# Patient Record
Sex: Male | Born: 1945 | Race: White | Hispanic: No | Marital: Married | Smoking: Current every day smoker
Health system: Southern US, Community
[De-identification: ages and names within clinical notes are randomized; demographics above are authoritative.]

## PROBLEM LIST (undated history)

## (undated) DIAGNOSIS — J439 Emphysema, unspecified: Secondary | ICD-10-CM

## (undated) DIAGNOSIS — I10 Essential (primary) hypertension: Secondary | ICD-10-CM

## (undated) DIAGNOSIS — M1990 Unspecified osteoarthritis, unspecified site: Secondary | ICD-10-CM

## (undated) HISTORY — PX: ABDOMINAL AORTA STENT: SHX1108

## (undated) HISTORY — DX: Unspecified osteoarthritis, unspecified site: M19.90

## (undated) HISTORY — PX: SHOULDER SURGERY: SHX246

## (undated) HISTORY — DX: Essential (primary) hypertension: I10

## (undated) HISTORY — PX: AMPUTATION FINGER: SHX6594

---

## 2021-01-09 ENCOUNTER — Encounter: Payer: Self-pay | Admitting: Nurse Practitioner

## 2021-01-09 LAB — LIPID PANEL
Cholesterol: 135 (ref 0–200)
HDL: 37 (ref 35–70)
LDL Cholesterol: 80
Triglycerides: 96 (ref 40–160)

## 2021-01-09 LAB — CBC AND DIFFERENTIAL
HCT: 49 (ref 41–53)
Hemoglobin: 16.8 (ref 13.5–17.5)
Neutrophils Absolute: 67
Platelets: 211 10*3/uL (ref 150–400)
WBC: 8.8

## 2021-01-09 LAB — BASIC METABOLIC PANEL
BUN: 22 — AB (ref 4–21)
CO2: 22 (ref 13–22)
Chloride: 103 (ref 99–108)
Creatinine: 1.2 (ref 0.6–1.3)
Glucose: 102
Potassium: 4.8 mEq/L (ref 3.5–5.1)
Sodium: 141 (ref 137–147)

## 2021-01-09 LAB — IRON,TIBC AND FERRITIN PANEL: Ferritin: 171

## 2021-01-09 LAB — HEPATIC FUNCTION PANEL
ALT: 24 U/L (ref 10–40)
AST: 24 (ref 14–40)
Alkaline Phosphatase: 70 (ref 25–125)
Bilirubin, Total: 0.4

## 2021-01-09 LAB — COMPREHENSIVE METABOLIC PANEL
Albumin: 4.6 (ref 3.5–5.0)
Calcium: 9.4 (ref 8.7–10.7)
Globulin: 2.5

## 2021-01-09 LAB — VITAMIN B12: Vitamin B-12: 418

## 2021-01-09 LAB — PSA: PSA: 3.3

## 2021-01-09 LAB — CBC: RBC: 5.42 — AB (ref 3.87–5.11)

## 2021-01-09 LAB — TESTOSTERONE: Testosterone: 328

## 2022-01-02 ENCOUNTER — Encounter: Payer: Self-pay | Admitting: Family Medicine

## 2022-01-02 ENCOUNTER — Ambulatory Visit
Admission: RE | Admit: 2022-01-02 | Discharge: 2022-01-02 | Disposition: A | Discharge status: Home / Self Care | Payer: Medicare FFS | Source: Ambulatory Visit | Attending: Family Medicine | Admitting: Family Medicine

## 2022-01-02 ENCOUNTER — Ambulatory Visit (INDEPENDENT_AMBULATORY_CARE_PROVIDER_SITE_OTHER): Payer: Medicare FFS | Admitting: Family Medicine

## 2022-01-02 ENCOUNTER — Ambulatory Visit
Admission: RE | Admit: 2022-01-02 | Discharge: 2022-01-02 | Disposition: A | Discharge status: Home / Self Care | Payer: Medicare FFS | Attending: Family Medicine | Admitting: Family Medicine

## 2022-01-02 VITALS — BP 147/76 | HR 76 | Temp 97.9°F | Ht 68.5 in | Wt 199.4 lb

## 2022-01-02 DIAGNOSIS — Z1159 Encounter for screening for other viral diseases: Secondary | ICD-10-CM

## 2022-01-02 DIAGNOSIS — M25511 Pain in right shoulder: Secondary | ICD-10-CM

## 2022-01-02 DIAGNOSIS — Z85828 Personal history of other malignant neoplasm of skin: Secondary | ICD-10-CM

## 2022-01-02 DIAGNOSIS — Z8619 Personal history of other infectious and parasitic diseases: Secondary | ICD-10-CM | POA: Diagnosis not present

## 2022-01-02 DIAGNOSIS — I6930 Unspecified sequelae of cerebral infarction: Secondary | ICD-10-CM

## 2022-01-02 DIAGNOSIS — I10 Essential (primary) hypertension: Secondary | ICD-10-CM

## 2022-01-02 DIAGNOSIS — E782 Mixed hyperlipidemia: Secondary | ICD-10-CM

## 2022-01-02 DIAGNOSIS — M2550 Pain in unspecified joint: Secondary | ICD-10-CM

## 2022-01-02 DIAGNOSIS — Z114 Encounter for screening for human immunodeficiency virus [HIV]: Secondary | ICD-10-CM

## 2022-01-02 DIAGNOSIS — Z9889 Other specified postprocedural states: Secondary | ICD-10-CM

## 2022-01-02 DIAGNOSIS — R3911 Hesitancy of micturition: Secondary | ICD-10-CM

## 2022-01-02 LAB — URINALYSIS, ROUTINE W REFLEX MICROSCOPIC
Bilirubin, UA: NEGATIVE
Glucose, UA: NEGATIVE
Ketones, UA: NEGATIVE
Leukocytes,UA: NEGATIVE
Nitrite, UA: NEGATIVE
Protein,UA: NEGATIVE
RBC, UA: NEGATIVE
Specific Gravity, UA: 1.01 (ref 1.005–1.030)
Urobilinogen, Ur: 0.2 mg/dL (ref 0.2–1.0)
pH, UA: 5.5 (ref 5.0–7.5)

## 2022-01-02 LAB — MICROALBUMIN, URINE WAIVED
Creatinine, Urine Waived: 50 mg/dL (ref 10–300)
Microalb, Ur Waived: 10 mg/L (ref 0–19)
Microalb/Creat Ratio: <30 mg/g (ref <30)

## 2022-01-02 MED ORDER — RAMIPRIL 10 MG PO CAPS: 10.0000 mg | ORAL_CAPSULE | Freq: Every day | ORAL | 1 refills | Status: DC

## 2022-01-02 MED ORDER — HYDROCHLOROTHIAZIDE 12.5 MG PO TABS: 12.5000 mg | ORAL_TABLET | Freq: Every day | ORAL | 1 refills | Status: DC

## 2022-01-02 MED ORDER — DICLOFENAC SODIUM 1 % EX GEL: 4.0000 g | Freq: Four times a day (QID) | CUTANEOUS | 12 refills | Status: DC

## 2022-01-02 MED ORDER — ATORVASTATIN CALCIUM 40 MG PO TABS: 40.0000 mg | ORAL_TABLET | Freq: Every day | ORAL | 1 refills | Status: DC

## 2022-01-02 NOTE — Assessment & Plan Note (Signed)
Referral to dermatology placed today. Will obtain records from previous PCP.  ?

## 2022-01-02 NOTE — Assessment & Plan Note (Addendum)
About a year ago. Treated 2x with antibiotics, but continues with symptoms. Will check labs for rheumatologic conditions and check for repeat infection. May need to see rheum for ? 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 Unclear if he was treated with atovaquone or just doxycycline. Await records from previous PCP.  ?

## 2022-01-02 NOTE — Assessment & Plan Note (Signed)
Likely due for repeat imaging- will await records from previous PCP and cardiologist. Continue to keep BP and cholesterol under good control. Labs drawn today. Await results.  ?

## 2022-01-02 NOTE — Assessment & Plan Note (Signed)
Under good control on current regimen. Continue current regimen. Continue to monitor. Call with any concerns. Refills given. Labs drawn today.   

## 2022-01-02 NOTE — Progress Notes (Signed)
? ?BP (!) 147/76   Pulse 76   Temp 97.9 ?F (36.6 ?C)   Ht 5' 8.5" (1.74 m)   Wt 199 lb 6.4 oz (90.4 kg)   SpO2 96%   BMI 29.88 kg/m?   ? ?Subjective:  ? ? Patient ID: Connor Holmes, male    DOB: 06/06/1946, 76 y.o.   MRN: [id number] ? ?HPI: ?Connor RinkJohn Holmes is a 76 y.o. male who presents today to establish care after moving from [place] ? ?Chief Complaint  ?Patient presents with  ? Establish Care  ?  Patient is here to establish care today, is coming from [place] in [place]   ? Hypertension  ? Hyperlipidemia  ? ?History of stroke, has some left over issues with drooling. ? ?SHOULDER PAIN ?Duration: 2 months ?Involved shoulder: right ?Mechanism of injury: unknown ?Location: diffuse ?Onset:gradual ?Severity: severe  ?Quality:  "pain" ?Frequency: intermittent ?Radiation: yes ?Aggravating factors: sitting in a recliner, moving, lifting    ?Alleviating factors: aleve ?Status: worse ?Treatments attempted: rest, ice, heat, and aleve  ?Relief with NSAIDs?:  mild ?Weakness: no ?Numbness: yes ?Decreased grip strength: no ?Redness: no ?Swelling: no ?Bruising: no ?Fevers: no ? ?HYPERTENSION / HYPERLIPIDEMIA ?Satisfied with current treatment? yes ?Duration of hypertension: chronic ?BP monitoring frequency: daily ?BP range: 120s/70s ?BP medication side effects: no ?Past BP meds: ramipril ?Duration of hyperlipidemia: chronic ?Cholesterol medication side effects: no ?Cholesterol supplements: none ?Past cholesterol medications: atorvastatin ?Medication compliance: excellent compliance ?Aspirin: yes ?Recent stressors: no ?Recurrent headaches: no ?Visual changes: no ?Palpitations: no ?Dyspnea: no ?Chest pain: no ?Lower extremity edema: no ?Dizzy/lightheaded: no ? ?Has been feeling foggy headed and having joint pain. Had lyme about a year ago, unclear how long he had had it for. Has had a lot of pain in his joints since then. He has otherwise been doing well with no other concerns or complaints at this time.  ? ?Active Ambulatory Problems  ?   Diagnosis Date Noted  ? Primary hypertension 01/02/2022  ? Mixed hyperlipidemia 01/02/2022  ? History of Lyme disease 01/02/2022  ? History of stroke with residual deficit 01/02/2022  ? History of AAA (abdominal aortic aneurysm) repair 01/02/2022  ? History of skin cancer 01/02/2022  ? ?Resolved Ambulatory Problems  ?  Diagnosis Date Noted  ? No Resolved Ambulatory Problems  ? ?Past Medical History:  ?Diagnosis Date  ? Arthritis   ? Hypertension   ? ?Past Surgical History:  ?Procedure Laterality Date  ? ABDOMINAL AORTA STENT    ? AMPUTATION FINGER    ? SHOULDER SURGERY Left   ? ?Outpatient Encounter Medications as of 01/02/2022  ?Medication Sig  ? aspirin EC 81 MG tablet Take 81 mg by mouth daily. Swallow whole.  ? cetirizine (ZYRTEC) 10 MG tablet Take 10 mg by mouth daily.  ? diclofenac Sodium (VOLTAREN) 1 % GEL Apply 4 g topically 4 (four) times daily.  ? [DISCONTINUED] atorvastatin (LIPITOR) 40 MG tablet Take 40 mg by mouth daily.  ? [DISCONTINUED] hydrochlorothiazide (HYDRODIURIL) 12.5 MG tablet Take 12.5 mg by mouth daily.  ? [DISCONTINUED] ramipril (ALTACE) 10 MG capsule Take 10 mg by mouth daily.  ? atorvastatin (LIPITOR) 40 MG tablet Take 1 tablet (40 mg total) by mouth daily.  ? hydrochlorothiazide (HYDRODIURIL) 12.5 MG tablet Take 1 tablet (12.5 mg total) by mouth daily.  ? ramipril (ALTACE) 10 MG capsule Take 1 capsule (10 mg total) by mouth daily.  ? ?No facility-administered encounter medications on file as of 01/02/2022.  ? ?Allergies  ?  Allergen Reactions  ? Other Nausea And Vomiting  ?  Patient states he is allergic to all opioids, he gets very sick.  ? ?Social History  ? ?Socioeconomic History  ? Marital status: Married  ?  Spouse name: Ana (LuLu)  ? Number of children: Not on file  ? Years of education: Not on file  ? Highest education level: Not on file  ?Occupational History  ? Not on file  ?Tobacco Use  ? Smoking status: Every Day  ?  Packs/day: 1.00  ?  Types: Cigarettes  ? Smokeless tobacco:  Never  ?Vaping Use  ? Vaping Use: Never used  ?Substance and Sexual Activity  ? Alcohol use: Yes  ?  Alcohol/week: 1.0 standard drink  ?  Types: 1 Shots of liquor per week  ? Drug use: Never  ? Sexual activity: Not Currently  ?Other Topics Concern  ? Not on file  ?Social History Narrative  ? Not on file  ? ?Social Determinants of Health  ? ?Financial Resource Strain: Not on file  ?Food Insecurity: Not on file  ?Transportation Needs: Not on file  ?Physical Activity: Not on file  ?Stress: Not on file  ?Social Connections: Not on file  ? ?Family History  ?Problem Relation Age of Onset  ? Alzheimer's disease Mother   ? Stroke Father   ? ? ? ?Review of Systems  ?Constitutional:  Positive for fatigue. Negative for activity change, appetite change, chills, diaphoresis, fever and unexpected weight change.  ?Respiratory: Negative.    ?Cardiovascular: Negative.   ?Gastrointestinal: Negative.   ?Musculoskeletal:  Positive for arthralgias and myalgias. Negative for back pain, gait problem, joint swelling, neck pain and neck stiffness.  ?Skin: Negative.   ?Neurological: Negative.   ?Psychiatric/Behavioral: Negative.    ? ?Per HPI unless specifically indicated above ? ?   ?Objective:  ?  ?BP (!) 147/76   Pulse 76   Temp 97.9 ?F (36.6 ?C)   Ht 5' 8.5" (1.74 m)   Wt 199 lb 6.4 oz (90.4 kg)   SpO2 96%   BMI 29.88 kg/m?   ?Wt Readings from Last 3 Encounters:  ?01/02/22 199 lb 6.4 oz (90.4 kg)  ?  ?Physical Exam ?Vitals and nursing note reviewed.  ?Constitutional:   ?   General: He is not in acute distress. ?   Appearance: Normal appearance. He is not ill-appearing, toxic-appearing or diaphoretic.  ?HENT:  ?   Head: Normocephalic and atraumatic.  ?   Right Ear: External ear normal.  ?   Left Ear: External ear normal.  ?   Nose: Nose normal.  ?   Mouth/Throat:  ?   Mouth: Mucous membranes are moist.  ?   Pharynx: Oropharynx is clear.  ?Eyes:  ?   General: No scleral icterus.    ?   Right eye: No discharge.     ?   Left eye: No  discharge.  ?   Extraocular Movements: Extraocular movements intact.  ?   Conjunctiva/sclera: Conjunctivae normal.  ?   Pupils: Pupils are equal, round, and reactive to light.  ?Cardiovascular:  ?   Rate and Rhythm: Normal rate and regular rhythm.  ?   Pulses: Normal pulses.  ?   Heart sounds: Normal heart sounds. No murmur heard. ?  No friction rub. No gallop.  ?Pulmonary:  ?   Effort: Pulmonary effort is normal. No respiratory distress.  ?   Breath sounds: Normal breath sounds. No stridor. No wheezing, rhonchi or rales.  ?Chest:  ?  Chest wall: No tenderness.  ?Musculoskeletal:     ?   General: Tenderness (AC joint) present. Normal range of motion.  ?   Cervical back: Normal range of motion and neck supple.  ?Skin: ?   General: Skin is warm and dry.  ?   Capillary Refill: Capillary refill takes less than 2 seconds.  ?   Coloration: Skin is not jaundiced or pale.  ?   Findings: No bruising, erythema, lesion or rash.  ?Neurological:  ?   General: No focal deficit present.  ?   Mental Status: He is alert and oriented to person, place, and time. Mental status is at baseline.  ?Psychiatric:     ?   Mood and Affect: Mood normal.     ?   Behavior: Behavior normal.     ?   Thought Content: Thought content normal.     ?   Judgment: Judgment normal.  ? ? ?Results for orders placed or performed in visit on 01/02/22  ?Urinalysis, Routine w reflex microscopic  ?Result Value Ref Range  ? Specific Gravity, UA 1.010 1.005 - 1.030  ? pH, UA 5.5 5.0 - 7.5  ? Color, UA Yellow Yellow  ? Appearance Ur Clear Clear  ? Leukocytes,UA Negative Negative  ? Protein,UA Negative Negative/Trace  ? Glucose, UA Negative Negative  ? Ketones, UA Negative Negative  ? RBC, UA Negative Negative  ? Bilirubin, UA Negative Negative  ? Urobilinogen, Ur 0.2 0.2 - 1.0 mg/dL  ? Nitrite, UA Negative Negative  ?Microalbumin, Urine Waived  ?Result Value Ref Range  ? Microalb, Ur Waived 10 0 - 19 mg/L  ? Creatinine, Urine Waived 50 10 - 300 mg/dL  ?  Microalb/Creat Ratio <30 <30 mg/g  ?CBC and differential  ?Result Value Ref Range  ? Hemoglobin 16.8 13.5 - 17.5  ? HCT 49 41 - 53  ? Neutrophils Absolute 67.00   ? Platelets 211 150 - 400 K/uL  ? WBC 8.8   ?CBC  ?Result Value Re

## 2022-01-02 NOTE — Assessment & Plan Note (Signed)
Running a little high today. Running well at home. Will continue current regimen and recheck 1 month. Call with any concerns. Refills given. Labs drawn today.  ?

## 2022-01-02 NOTE — Assessment & Plan Note (Signed)
Will keep BP and cholesterol under good control. Labs drawn today. Call with any concerns.  ?

## 2022-01-05 LAB — ANA+ENA+DNA/DS+ANTICH+CENTR
ANA Titer 1: NEGATIVE
Anti JO-1: <0.2 AI (ref 0.0–0.9)
Centromere Ab Screen: <0.2 AI (ref 0.0–0.9)
Chromatin Ab SerPl-aCnc: <0.2 AI (ref 0.0–0.9)
ENA RNP Ab: <0.2 AI (ref 0.0–0.9)
ENA SM Ab Ser-aCnc: <0.2 AI (ref 0.0–0.9)
ENA SSA (RO) Ab: <0.2 AI (ref 0.0–0.9)
ENA SSB (LA) Ab: <0.2 AI (ref 0.0–0.9)
Scleroderma (Scl-70) (ENA) Antibody, IgG: <0.2 AI (ref 0.0–0.9)
dsDNA Ab: <1 IU/mL (ref 0–9)

## 2022-01-05 LAB — LIPID PANEL W/O CHOL/HDL RATIO
Cholesterol, Total: 217 mg/dL — ABNORMAL HIGH (ref 100–199)
HDL: 39 mg/dL — ABNORMAL LOW (ref >39)
LDL Chol Calc (NIH): 151 mg/dL — ABNORMAL HIGH (ref 0–99)
Triglycerides: 150 mg/dL — ABNORMAL HIGH (ref 0–149)
VLDL Cholesterol Cal: 27 mg/dL (ref 5–40)

## 2022-01-05 LAB — COMPREHENSIVE METABOLIC PANEL
ALT: 25 IU/L (ref 0–44)
AST: 23 IU/L (ref 0–40)
Albumin/Globulin Ratio: 2 (ref 1.2–2.2)
Albumin: 4.9 g/dL — ABNORMAL HIGH (ref 3.7–4.7)
Alkaline Phosphatase: 61 IU/L (ref 44–121)
BUN/Creatinine Ratio: 15 (ref 10–24)
BUN: 18 mg/dL (ref 8–27)
Bilirubin Total: 0.4 mg/dL (ref 0.0–1.2)
CO2: 22 mmol/L (ref 20–29)
Calcium: 9.2 mg/dL (ref 8.6–10.2)
Chloride: 101 mmol/L (ref 96–106)
Creatinine, Ser: 1.17 mg/dL (ref 0.76–1.27)
Globulin, Total: 2.5 g/dL (ref 1.5–4.5)
Glucose: 92 mg/dL (ref 70–99)
Potassium: 4.5 mmol/L (ref 3.5–5.2)
Sodium: 140 mmol/L (ref 134–144)
Total Protein: 7.4 g/dL (ref 6.0–8.5)
eGFR: 65 mL/min/{1.73_m2} (ref >59)

## 2022-01-05 LAB — CBC WITH DIFFERENTIAL/PLATELET
Basophils Absolute: 0.1 10*3/uL (ref 0.0–0.2)
Basos: 1 %
EOS (ABSOLUTE): 0.2 10*3/uL (ref 0.0–0.4)
Eos: 2 %
Hematocrit: 54.5 % — ABNORMAL HIGH (ref 37.5–51.0)
Hemoglobin: 18.6 g/dL — ABNORMAL HIGH (ref 13.0–17.7)
Immature Grans (Abs): 0.1 10*3/uL (ref 0.0–0.1)
Immature Granulocytes: 1 %
Lymphocytes Absolute: 1.9 10*3/uL (ref 0.7–3.1)
Lymphs: 22 %
MCH: 32 pg (ref 26.6–33.0)
MCHC: 34.1 g/dL (ref 31.5–35.7)
MCV: 94 fL (ref 79–97)
Monocytes Absolute: 0.8 10*3/uL (ref 0.1–0.9)
Monocytes: 10 %
Neutrophils Absolute: 5.5 10*3/uL (ref 1.4–7.0)
Neutrophils: 64 %
Platelets: 191 10*3/uL (ref 150–450)
RBC: 5.81 x10E6/uL — ABNORMAL HIGH (ref 4.14–5.80)
RDW: 12.8 % (ref 11.6–15.4)
WBC: 8.6 10*3/uL (ref 3.4–10.8)

## 2022-01-05 LAB — HIV ANTIBODY (ROUTINE TESTING W REFLEX): HIV Screen 4th Generation wRfx: NONREACTIVE

## 2022-01-05 LAB — EHRLICHIA ANTIBODY PANEL
E. Chaffeensis (HME) IgM Titer: NEGATIVE
E.Chaffeensis (HME) IgG: NEGATIVE
HGE IgG Titer: NEGATIVE
HGE IgM Titer: NEGATIVE

## 2022-01-05 LAB — BABESIA MICROTI ANTIBODY PANEL
Babesia microti IgG: <1:10 {titer}
Babesia microti IgM: 1:160 {titer} — ABNORMAL HIGH

## 2022-01-05 LAB — TSH: TSH: 2.99 u[IU]/mL (ref 0.450–4.500)

## 2022-01-05 LAB — ROCKY MTN SPOTTED FVR ABS PNL(IGG+IGM)
RMSF IgG: POSITIVE — AB
RMSF IgM: 0.96 index — ABNORMAL HIGH (ref 0.00–0.89)

## 2022-01-05 LAB — RMSF, IGG, IFA: RMSF, IGG, IFA: 1:128 {titer} — ABNORMAL HIGH

## 2022-01-05 LAB — HEPATITIS C ANTIBODY: Hep C Virus Ab: NONREACTIVE

## 2022-01-05 LAB — CK: Total CK: 251 U/L (ref 41–331)

## 2022-01-05 LAB — URIC ACID: Uric Acid: 8 mg/dL (ref 3.8–8.4)

## 2022-01-05 LAB — PSA: Prostate Specific Ag, Serum: 4.2 ng/mL — ABNORMAL HIGH (ref 0.0–4.0)

## 2022-01-05 LAB — RHEUMATOID FACTOR: Rheumatoid fact SerPl-aCnc: <10 IU/mL (ref <14.0)

## 2022-01-05 LAB — LYME DISEASE SEROLOGY W/REFLEX: Lyme Total Antibody EIA: NEGATIVE

## 2022-01-07 ENCOUNTER — Other Ambulatory Visit: Payer: Self-pay | Admitting: Family Medicine

## 2022-01-07 MED ORDER — DOXYCYCLINE HYCLATE 100 MG PO TABS: 100.0000 mg | ORAL_TABLET | Freq: Two times a day (BID) | ORAL | 0 refills | Status: AC

## 2022-01-07 MED ORDER — ATOVAQUONE 750 MG/5ML PO SUSP: 750.0000 mg | Freq: Two times a day (BID) | ORAL | 0 refills | Status: AC

## 2022-01-09 ENCOUNTER — Telehealth: Payer: Self-pay | Admitting: Family Medicine

## 2022-01-09 NOTE — Telephone Encounter (Signed)
Copied from CRM [id number]. Topic: General - Call Back - No Documentation ?>> Jan 09, 2022  9:12 AM Marylen Ponto wrote: ?Reason for CRM: Pt stated he was returning call to [facility] regarding his lab results. Pt also stated he will be working outside but requests that she call him back. Cb# [phone] ?

## 2022-01-10 ENCOUNTER — Encounter: Payer: Self-pay | Admitting: Family Medicine

## 2022-01-10 DIAGNOSIS — I5189 Other ill-defined heart diseases: Secondary | ICD-10-CM | POA: Insufficient documentation

## 2022-01-10 DIAGNOSIS — R7301 Impaired fasting glucose: Secondary | ICD-10-CM | POA: Insufficient documentation

## 2022-02-05 ENCOUNTER — Ambulatory Visit (INDEPENDENT_AMBULATORY_CARE_PROVIDER_SITE_OTHER): Payer: Medicare FFS | Admitting: Family Medicine

## 2022-02-05 ENCOUNTER — Encounter: Payer: Self-pay | Admitting: Family Medicine

## 2022-02-05 VITALS — BP 127/74 | HR 64 | Temp 98.0°F | Wt 197.4 lb

## 2022-02-05 DIAGNOSIS — R4189 Other symptoms and signs involving cognitive functions and awareness: Secondary | ICD-10-CM | POA: Diagnosis not present

## 2022-02-05 DIAGNOSIS — R972 Elevated prostate specific antigen [PSA]: Secondary | ICD-10-CM | POA: Diagnosis not present

## 2022-02-05 DIAGNOSIS — M25511 Pain in right shoulder: Secondary | ICD-10-CM

## 2022-02-05 DIAGNOSIS — I10 Essential (primary) hypertension: Secondary | ICD-10-CM | POA: Diagnosis not present

## 2022-02-05 DIAGNOSIS — M4722 Other spondylosis with radiculopathy, cervical region: Secondary | ICD-10-CM | POA: Diagnosis not present

## 2022-02-05 MED ORDER — CETIRIZINE HCL 10 MG PO TABS: 10.0000 mg | ORAL_TABLET | Freq: Every day | ORAL | 4 refills | Status: DC

## 2022-02-05 NOTE — Progress Notes (Signed)
BP 127/74   Pulse 64   Temp 98 F (36.7 C)   Wt 197 lb 6.4 oz (89.5 kg)   SpO2 98%   BMI 29.58 kg/m    Subjective:    Patient ID: Connor Holmes, male    DOB: 1946/04/09, 76 y.o.   MRN: [id number]  HPI: Connor Holmes is a 76 y.o. male  Chief Complaint  Patient presents with   Hypertension   Shoulder Pain    Patient states his right shoulder pain is about the same    Lyme Disease    Patient states he has some concerns about lyme disease    Has been having "fuzziness" in his head. Almost constant. Sometimes goes away later in the day. He is not sure if anything makes it better or worse, but he does note that taking his allergy pill sometimes helps with it.   HYPERTENSION Hypertension status: controlled  Satisfied with current treatment? yes Duration of hypertension: chronic BP monitoring frequency:  not checking BP medication side effects:  no Medication compliance: good compliance Previous BP meds:rapipril, HCTZ Aspirin: no Recurrent headaches: no Visual changes: no Palpitations: no Dyspnea: no Chest pain: no Lower extremity edema: no Dizzy/lightheaded: no  SHOULDER PAIN Duration: 3 months Involved shoulder: right Mechanism of injury: unknown Location: diffuse Onset:gradual Severity: severe  Quality:  pain Frequency: intermittent Radiation: yes Aggravating factors: sitting in a recliner, moving, lifting  Alleviating factors: aleve  Status: worse Treatments attempted: rest, ice, heat, sling, APAP, ibuprofen, aleve, and HEP  Relief with NSAIDs?:  mild Weakness: no Numbness: yes Decreased grip strength: no Redness: no Swelling: no Bruising: no Fevers: no   Relevant past medical, surgical, family and social history reviewed and updated as indicated. Interim medical history since our last visit reviewed. Allergies and medications reviewed and updated.  Review of Systems  Constitutional: Negative.   HENT: Negative.    Eyes: Negative.   Respiratory:  Negative.    Cardiovascular: Negative.   Gastrointestinal: Negative.   Musculoskeletal:  Positive for arthralgias. Negative for back pain, gait problem, joint swelling, myalgias, neck pain and neck stiffness.  Neurological: Negative.   Hematological: Negative.   Psychiatric/Behavioral: Negative.      Per HPI unless specifically indicated above     Objective:    BP 127/74   Pulse 64   Temp 98 F (36.7 C)   Wt 197 lb 6.4 oz (89.5 kg)   SpO2 98%   BMI 29.58 kg/m   Wt Readings from Last 3 Encounters:  02/05/22 197 lb 6.4 oz (89.5 kg)  01/02/22 199 lb 6.4 oz (90.4 kg)    Physical Exam Vitals and nursing note reviewed.  Constitutional:      General: He is not in acute distress.    Appearance: Normal appearance. He is not ill-appearing, toxic-appearing or diaphoretic.  HENT:     Head: Normocephalic and atraumatic.     Right Ear: External ear normal.     Left Ear: External ear normal.     Nose: Nose normal.     Mouth/Throat:     Mouth: Mucous membranes are moist.     Pharynx: Oropharynx is clear.  Eyes:     General: No scleral icterus.       Right eye: No discharge.        Left eye: No discharge.     Extraocular Movements: Extraocular movements intact.     Conjunctiva/sclera: Conjunctivae normal.     Pupils: Pupils are equal, round, and reactive to  light.  Cardiovascular:     Rate and Rhythm: Normal rate and regular rhythm.     Pulses: Normal pulses.     Heart sounds: Normal heart sounds. No murmur heard.    No friction rub. No gallop.  Pulmonary:     Effort: Pulmonary effort is normal. No respiratory distress.     Breath sounds: Normal breath sounds. No stridor. No wheezing, rhonchi or rales.  Chest:     Chest wall: No tenderness.  Musculoskeletal:        General: Normal range of motion.     Cervical back: Normal range of motion and neck supple.  Skin:    General: Skin is warm and dry.     Capillary Refill: Capillary refill takes less than 2 seconds.      Coloration: Skin is not jaundiced or pale.     Findings: No bruising, erythema, lesion or rash.  Neurological:     General: No focal deficit present.     Mental Status: He is alert and oriented to person, place, and time. Mental status is at baseline.  Psychiatric:        Mood and Affect: Mood normal.        Behavior: Behavior normal.        Thought Content: Thought content normal.        Judgment: Judgment normal.     Results for orders placed or performed in visit on 01/02/22  Comprehensive metabolic panel  Result Value Ref Range   Glucose 92 70 - 99 mg/dL   BUN 18 8 - 27 mg/dL   Creatinine, Ser 1.17 0.76 - 1.27 mg/dL   eGFR 65 >59 mL/min/1.73   BUN/Creatinine Ratio 15 10 - 24   Sodium 140 134 - 144 mmol/L   Potassium 4.5 3.5 - 5.2 mmol/L   Chloride 101 96 - 106 mmol/L   CO2 22 20 - 29 mmol/L   Calcium 9.2 8.6 - 10.2 mg/dL   Total Protein 7.4 6.0 - 8.5 g/dL   Albumin 4.9 (H) 3.7 - 4.7 g/dL   Globulin, Total 2.5 1.5 - 4.5 g/dL   Albumin/Globulin Ratio 2.0 1.2 - 2.2   Bilirubin Total 0.4 0.0 - 1.2 mg/dL   Alkaline Phosphatase 61 44 - 121 IU/L   AST 23 0 - 40 IU/L   ALT 25 0 - 44 IU/L  CBC with Differential/Platelet  Result Value Ref Range   WBC 8.6 3.4 - 10.8 x10E3/uL   RBC 5.81 (H) 4.14 - 5.80 x10E6/uL   Hemoglobin 18.6 (H) 13.0 - 17.7 g/dL   Hematocrit 54.5 (H) 37.5 - 51.0 %   MCV 94 79 - 97 fL   MCH 32.0 26.6 - 33.0 pg   MCHC 34.1 31.5 - 35.7 g/dL   RDW 12.8 11.6 - 15.4 %   Platelets 191 150 - 450 x10E3/uL   Neutrophils 64 Not Estab. %   Lymphs 22 Not Estab. %   Monocytes 10 Not Estab. %   Eos 2 Not Estab. %   Basos 1 Not Estab. %   Neutrophils Absolute 5.5 1.4 - 7.0 x10E3/uL   Lymphocytes Absolute 1.9 0.7 - 3.1 x10E3/uL   Monocytes Absolute 0.8 0.1 - 0.9 x10E3/uL   EOS (ABSOLUTE) 0.2 0.0 - 0.4 x10E3/uL   Basophils Absolute 0.1 0.0 - 0.2 x10E3/uL   Immature Granulocytes 1 Not Estab. %   Immature Grans (Abs) 0.1 0.0 - 0.1 x10E3/uL  Lipid Panel w/o Chol/HDL  Ratio  Result Value Ref Range  Cholesterol, Total 217 (H) 100 - 199 mg/dL   Triglycerides 150 (H) 0 - 149 mg/dL   HDL 39 (L) >39 mg/dL   VLDL Cholesterol Cal 27 5 - 40 mg/dL   LDL Chol Calc (NIH) 151 (H) 0 - 99 mg/dL  PSA  Result Value Ref Range   Prostate Specific Ag, Serum 4.2 (H) 0.0 - 4.0 ng/mL  TSH  Result Value Ref Range   TSH 2.990 0.450 - 4.500 uIU/mL  Urinalysis, Routine w reflex microscopic  Result Value Ref Range   Specific Gravity, UA 1.010 1.005 - 1.030   pH, UA 5.5 5.0 - 7.5   Color, UA Yellow Yellow   Appearance Ur Clear Clear   Leukocytes,UA Negative Negative   Protein,UA Negative Negative/Trace   Glucose, UA Negative Negative   Ketones, UA Negative Negative   RBC, UA Negative Negative   Bilirubin, UA Negative Negative   Urobilinogen, Ur 0.2 0.2 - 1.0 mg/dL   Nitrite, UA Negative Negative  Microalbumin, Urine Waived  Result Value Ref Range   Microalb, Ur Waived 10 0 - 19 mg/L   Creatinine, Urine Waived 50 10 - 300 mg/dL   Microalb/Creat Ratio <30 <30 mg/g  HIV Antibody (routine testing w rflx)  Result Value Ref Range   HIV Screen 4th Generation wRfx Non Reactive Non Reactive  Hepatitis C Antibody  Result Value Ref Range   Hep C Virus Ab Non Reactive Non Reactive  Rocky mtn spotted fvr abs pnl(IgG+IgM)  Result Value Ref Range   RMSF IgG Positive (A) Negative   RMSF IgM 0.96 (H) 0.00 - 0.89 index  Babesia microti Antibody Panel  Result Value Ref Range   Babesia microti IgM 1:160 (H) Neg:<1:10   Babesia microti IgG <9:67 RFF:<6:38  Ehrlichia Antibody Panel  Result Value Ref Range   E.Chaffeensis (HME) IgG Negative Neg:<1:64   E. Chaffeensis (HME) IgM Titer Negative Neg:<1:20   HGE IgG Titer Negative Neg:<1:64   HGE IgM Titer Negative Neg:<1:20  Rheumatoid factor  Result Value Ref Range   Rhuematoid fact SerPl-aCnc <10.0 <14.0 IU/mL  ANA+ENA+DNA/DS+Antich+Centr  Result Value Ref Range   ANA Titer 1 Negative    dsDNA Ab <1 0 - 9 IU/mL   ENA RNP  Ab <0.2 0.0 - 0.9 AI   ENA SM Ab Ser-aCnc <0.2 0.0 - 0.9 AI   Scleroderma (Scl-70) (ENA) Antibody, IgG <0.2 0.0 - 0.9 AI   ENA SSA (RO) Ab <0.2 0.0 - 0.9 AI   ENA SSB (LA) Ab <0.2 0.0 - 0.9 AI   Chromatin Ab SerPl-aCnc <0.2 0.0 - 0.9 AI   Anti JO-1 <0.2 0.0 - 0.9 AI   Centromere Ab Screen <0.2 0.0 - 0.9 AI   See below: Comment   CK  Result Value Ref Range   Total CK 251 41 - 331 U/L  Uric acid  Result Value Ref Range   Uric Acid 8.0 3.8 - 8.4 mg/dL  CBC and differential  Result Value Ref Range   Hemoglobin 16.8 13.5 - 17.5   HCT 49 41 - 53   Neutrophils Absolute 67.00    Platelets 211 150 - 400 K/uL   WBC 8.8   CBC  Result Value Ref Range   RBC 5.42 (A) 3.87 - 5.11  Iron, TIBC and Ferritin Panel  Result Value Ref Range   Ferritin 466   Basic metabolic panel  Result Value Ref Range   Glucose 102    BUN 22 (A) 4 - 21  CO2 22 13 - 22   Creatinine 1.2 0.6 - 1.3   Potassium 4.8 3.5 - 5.1 mEq/L   Sodium 141 137 - 147   Chloride 103 99 - 108  Comprehensive metabolic panel  Result Value Ref Range   Globulin 2.5    Calcium 9.4 8.7 - 10.7   Albumin 4.6 3.5 - 5.0  Lipid panel  Result Value Ref Range   Triglycerides 96 40 - 160   Cholesterol 135 0 - 200   HDL 37 35 - 70   LDL Cholesterol 80   Hepatic function panel  Result Value Ref Range   Alkaline Phosphatase 70 25 - 125   ALT 24 10 - 40 U/L   AST 24 14 - 40   Bilirubin, Total 0.4   Vitamin B12  Result Value Ref Range   Vitamin B-12 418   PSA  Result Value Ref Range   PSA 3.3   Testosterone  Result Value Ref Range   Testosterone 328   Lyme Disease Serology w/Reflex  Result Value Ref Range   Lyme Total Antibody EIA Negative Negative  RMSF, IgG, IFA  Result Value Ref Range   RMSF, IGG, IFA 1:128 (H) Neg <1:64      Assessment & Plan:   Problem List Items Addressed This Visit       Cardiovascular and Mediastinum   Primary hypertension    Has not been taking his HCTZ, but BP is doing well. Continue  current regimen. Continue to monitor.       Relevant Orders   Basic metabolic panel     Nervous and Auditory   Osteoarthritis of spine with radiculopathy, cervical region - Primary    Discussed x-ray results. Discussed PT vs referral to ortho. He would like to go see ortho. Referral generated today. Await their input.       Relevant Orders   Ambulatory referral to Orthopedic Surgery     Other   Brain fog    Discussed that there are several things that can cause brain fuzziness or brain fog. He notes that he thinks it's better when taking an allergy pill. Encouraged him to continue to take his allergy pill and increase his water intake. Call if not getting better or getting worse. Continue to monitor.       Other Visit Diagnoses     Elevated PSA       Rechecking labs today. Await results. Treat as needed.    Relevant Orders   PSA   Acute pain of right shoulder       Likely due to cervical DJD. Would like to see ortho. Referral generated today.   Relevant Orders   Ambulatory referral to Orthopedic Surgery        Follow up plan: Return in about 6 months (around 08/07/2022), or or before he goes to [place] for the Winter, for Physical.

## 2022-02-05 NOTE — Assessment & Plan Note (Signed)
Discussed that there are several things that can cause brain fuzziness or brain fog. He notes that he thinks it's better when taking an allergy pill. Encouraged him to continue to take his allergy pill and increase his water intake. Call if not getting better or getting worse. Continue to monitor.

## 2022-02-05 NOTE — Assessment & Plan Note (Signed)
Has not been taking his HCTZ, but BP is doing well. Continue current regimen. Continue to monitor.

## 2022-02-05 NOTE — Assessment & Plan Note (Signed)
Discussed x-ray results. Discussed PT vs referral to ortho. He would like to go see ortho. Referral generated today. Await their input.

## 2022-02-06 LAB — BASIC METABOLIC PANEL
BUN/Creatinine Ratio: 14 (ref 10–24)
BUN: 18 mg/dL (ref 8–27)
CO2: 23 mmol/L (ref 20–29)
Calcium: 10 mg/dL (ref 8.6–10.2)
Chloride: 101 mmol/L (ref 96–106)
Creatinine, Ser: 1.27 mg/dL (ref 0.76–1.27)
Glucose: 108 mg/dL — ABNORMAL HIGH (ref 70–99)
Potassium: 4.9 mmol/L (ref 3.5–5.2)
Sodium: 138 mmol/L (ref 134–144)
eGFR: 59 mL/min/{1.73_m2} — ABNORMAL LOW (ref >59)

## 2022-02-06 LAB — PSA: Prostate Specific Ag, Serum: 4.4 ng/mL — ABNORMAL HIGH (ref 0.0–4.0)

## 2022-03-20 ENCOUNTER — Ambulatory Visit (INDEPENDENT_AMBULATORY_CARE_PROVIDER_SITE_OTHER): Payer: Medicare FFS | Admitting: *Deleted

## 2022-03-20 DIAGNOSIS — Z0000 Encounter for general adult medical examination without abnormal findings: Secondary | ICD-10-CM

## 2022-03-20 NOTE — Progress Notes (Addendum)
Subjective:   Connor Holmes is a 76 y.o. male who presents for Medicare Annual/Subsequent preventive examination.  I connected with  Connor Holmes on 03/20/22 by a telephone enabled telemedicine application and verified that I am speaking with the correct person using two identifiers.   I discussed the limitations of evaluation and management by telemedicine. The patient expressed understanding and agreed to proceed.  Patient location: home  Provider location:  tele-health-home    Review of Systems     Cardiac Risk Factors include: advanced age (>64men, >67 women);obesity (BMI >30kg/m2);hypertension;male gender;smoking/ tobacco exposure     Objective:    Today's Vitals   03/20/22 1103  PainSc: 4    There is no height or weight on file to calculate BMI.      No data to display          Current Medications (verified) Outpatient Encounter Medications as of 03/20/2022  Medication Sig   atorvastatin (LIPITOR) 40 MG tablet Take 1 tablet (40 mg total) by mouth daily.   budesonide-formoterol (SYMBICORT) 80-4.5 MCG/ACT inhaler Inhale 2 puffs into the lungs 2 (two) times daily.   cetirizine (ZYRTEC) 10 MG tablet Take 1 tablet (10 mg total) by mouth daily.   fluticasone (FLONASE) 50 MCG/ACT nasal spray Place into both nostrils daily.   meloxicam (MOBIC) 15 MG tablet Take 15 mg by mouth daily. 1 x a day   ramipril (ALTACE) 10 MG capsule Take 1 capsule (10 mg total) by mouth daily.   aspirin EC 81 MG tablet Take 81 mg by mouth daily. Swallow whole. (Patient not taking: Reported on 02/05/2022)   No facility-administered encounter medications on file as of 03/20/2022.    Allergies (verified) Other   History: Past Medical History:  Diagnosis Date   Arthritis    Hypertension    Past Surgical History:  Procedure Laterality Date   ABDOMINAL AORTA STENT     AMPUTATION FINGER     SHOULDER SURGERY Left    Family History  Problem Relation Age of Onset   Alzheimer's disease  Mother    Stroke Father    Social History   Socioeconomic History   Marital status: Married    Spouse name: Ana (LuLu)   Number of children: Not on file   Years of education: Not on file   Highest education level: Not on file  Occupational History   Not on file  Tobacco Use   Smoking status: Every Day    Packs/day: 1.00    Types: Cigarettes   Smokeless tobacco: Never  Vaping Use   Vaping Use: Never used  Substance and Sexual Activity   Alcohol use: Yes    Alcohol/week: 1.0 standard drink of alcohol    Types: 1 Shots of liquor per week   Drug use: Never   Sexual activity: Not Currently  Other Topics Concern   Not on file  Social History Narrative   Not on file   Social Determinants of Health   Financial Resource Strain: Low Risk  (03/20/2022)   Overall Financial Resource Strain (CARDIA)    Difficulty of Paying Living Expenses: Not hard at all  Food Insecurity: No Food Insecurity (03/20/2022)   Hunger Vital Sign    Worried About Running Out of Food in the Last Year: Never true    Ran Out of Food in the Last Year: Never true  Transportation Needs: No Transportation Needs (03/20/2022)   PRAPARE - Administrator, Civil Service (Medical): No  Lack of Transportation (Non-Medical): No  Physical Activity: Inactive (03/20/2022)   Exercise Vital Sign    Days of Exercise per Week: 0 days    Minutes of Exercise per Session: 0 min  Stress: No Stress Concern Present (03/20/2022)   [employer] of Occupational Health - Occupational Stress Questionnaire    Feeling of Stress : Not at all  Social Connections: Socially Integrated (03/20/2022)   Social Connection and Isolation Panel [NHANES]    Frequency of Communication with Friends and Family: Twice a week    Frequency of Social Gatherings with Friends and Family: More than three times a week    Attends Religious Services: More than 4 times per year    Active Member of [employer] or Organizations: Yes    Attends Museum/gallery exhibitions officer: More than 4 times per year    Marital Status: Married    Tobacco Counseling Ready to quit: Not Answered Counseling given: Not Answered   Clinical Intake:  Pre-visit preparation completed: Yes  Pain : 0-10 Pain Score: 4  Pain Type: Chronic pain Pain Location: Shoulder Pain Orientation: Right Pain Descriptors / Indicators: Constant, Burning, Aching Pain Onset: 1 to 4 weeks ago Pain Relieving Factors: aleve  Pain Relieving Factors: aleve  Nutritional Risks: None Diabetes: No  How often do you need to have someone help you when you read instructions, pamphlets, or other written materials from your doctor or pharmacy?: 1 - Never  Diabetic?  no  Interpreter Needed?: No  Information entered by :: Remi Haggard LPN   Activities of Daily Living    03/20/2022   12:11 PM  In your present state of health, do you have any difficulty performing the following activities:  Hearing? 0  Vision? 0  Difficulty concentrating or making decisions? 0  Walking or climbing stairs? 0  Dressing or bathing? 0  Doing errands, shopping? 0  Preparing Food and eating ? N  Using the Toilet? N  In the past six months, have you accidently leaked urine? N  Do you have problems with loss of bowel control? N  Managing your Medications? N  Managing your Finances? N    Patient Care Team: Dorcas Carrow, DO as PCP - General (Family Medicine)  Indicate any recent Medical Services you may have received from other than [facility] providers in the past year (date may be approximate).     Assessment:   This is a routine wellness examination for Connor Holmes.  Hearing/Vision screen Hearing Screening - Comments:: No trouble hearing Vision Screening - Comments:: Not up to date  Dietary issues and exercise activities discussed: Current Exercise Habits: The patient does not participate in regular exercise at present   Goals Addressed             This Visit's Progress    Patient  Stated       Continue current lifestyle       Depression Screen    03/20/2022   11:25 AM 02/05/2022    9:19 AM 01/02/2022    9:21 AM  PHQ 2/9 Scores  PHQ - 2 Score  0 0  PHQ- 9 Score  0 9  Exception Documentation Patient refusal      Fall Risk    01/02/2022    9:21 AM  Fall Risk   Falls in the past year? 0  Number falls in past yr: 0  Injury with Fall? 0  Risk for fall due to : No Fall Risks  Follow up Falls evaluation  completed    FALL RISK PREVENTION PERTAINING TO THE HOME:  Any stairs in or around the home? Yes  If so, are there any without handrails? No  Home free of loose throw rugs in walkways, pet beds, electrical cords, etc? Yes  Adequate lighting in your home to reduce risk of falls? Yes   ASSISTIVE DEVICES UTILIZED TO PREVENT FALLS:  Life alert? No  Use of a cane, walker or w/c? No  Grab bars in the bathroom? Yes  Shower chair or bench in shower? Yes  Elevated toilet seat or a handicapped toilet? No   TIMED UP AND GO:  Was the test performed? No .    Cognitive Function:        03/20/2022   11:08 AM  6CIT Screen  What Year? 0 points  What month? 0 points  What time? 0 points  Count back from 20 0 points  Months in reverse 4 points    Immunizations  There is no immunization history on file for this patient.  TDAP status: Due, Education has been provided regarding the importance of this vaccine. Advised may receive this vaccine at local pharmacy or Health Dept. Aware to provide a copy of the vaccination record if obtained from local pharmacy or Health Dept. Verbalized acceptance and understanding.  Flu Vaccine status: Due, Education has been provided regarding the importance of this vaccine. Advised may receive this vaccine at local pharmacy or Health Dept. Aware to provide a copy of the vaccination record if obtained from local pharmacy or Health Dept. Verbalized acceptance and understanding.  Pneumococcal vaccine status: Due, Education has  been provided regarding the importance of this vaccine. Advised may receive this vaccine at local pharmacy or Health Dept. Aware to provide a copy of the vaccination record if obtained from local pharmacy or Health Dept. Verbalized acceptance and understanding.  Covid-19 vaccine status: Information provided on how to obtain vaccines.   Qualifies for Shingles Vaccine? Yes   Zostavax completed No   Shingrix Completed?: No.    Education has been provided regarding the importance of this vaccine. Patient has been advised to call insurance company to determine out of pocket expense if they have not yet received this vaccine. Advised may also receive vaccine at local pharmacy or Health Dept. Verbalized acceptance and understanding.  Screening Tests Health Maintenance  Topic Date Due   Zoster Vaccines- Shingrix (1 of 2) 04/04/2022 (Originally 11/25/1995)   COVID-19 Vaccine (1) 04/05/2022 (Originally 05/26/1946)   Pneumonia Vaccine 52+ Years old (1 - PCV) 03/21/2023 (Originally 11/25/1951)   TETANUS/TDAP  03/21/2023 (Originally 11/24/1964)   INFLUENZA VACCINE  03/26/2022   Hepatitis C Screening  Completed   HPV VACCINES  Aged Out    Health Maintenance  There are no preventive care reminders to display for this patient.   Colorectal cancer screening: No longer required.   Lung Cancer Screening: (Low Dose CT Chest recommended if Age 57-80 years, 30 pack-year currently smoking OR have quit w/in 15years.) q    Additional Screening:  Hepatitis C Screening: does not qualify; Completed 2023  Vision Screening: Recommended annual ophthalmology exams for early detection of glaucoma and other disorders of the eye. Is the patient up to date with their annual eye exam?  No  Who is the provider or what is the name of the office in which the patient attends annual eye exams? Information given to wife  If pt is not established with a provider, would they like to be referred to a  provider to establish care?  Yes .   Dental Screening: Recommended annual dental exams for proper oral hygiene  Community Resource Referral / Chronic Care Management: CRR required this visit?  No   CCM required this visit?  No      Plan:     I have personally reviewed and noted the following in the patient's chart:   Medical and social history Use of alcohol, tobacco or illicit drugs  Current medications and supplements including opioid prescriptions. Patient is not currently taking opioid prescriptions. Functional ability and status Nutritional status Physical activity Advanced directives List of other physicians Hospitalizations, surgeries, and ER visits in previous 12 months Vitals Screenings to include cognitive, depression, and falls Referrals and appointments  In addition, I have reviewed and discussed with patient certain preventive protocols, quality metrics, and best practice recommendations. A written personalized care plan for preventive services as well as general preventive health recommendations were provided to patient.     Remi Haggard, LPN   12/02/8117   Nurse Notes:

## 2022-03-20 NOTE — Patient Instructions (Signed)
Connor Holmes , Thank you for taking time to come for your Medicare Wellness Visit. I appreciate your ongoing commitment to your health goals. Please review the following plan we discussed and let me know if I can assist you in the future.   Screening recommendations/referrals: Colonoscopy: up to date Recommended yearly ophthalmology/optometry visit for glaucoma screening and checkup Recommended yearly dental visit for hygiene and checkup  Vaccinations: Influenza vaccine: Education provided Pneumococcal vaccine: Education provided Tdap vaccine: Education provided Shingles vaccine: Education provided    Advanced directives: Education provided      Preventive Care 65 Years and Older, Male Preventive care refers to lifestyle choices and visits with your health care provider that can promote health and wellness. What does preventive care include? A yearly physical exam. This is also called an annual well check. Dental exams once or twice a year. Routine eye exams. Ask your health care provider how often you should have your eyes checked. Personal lifestyle choices, including: Daily care of your teeth and gums. Regular physical activity. Eating a healthy diet. Avoiding tobacco and drug use. Limiting alcohol use. Practicing safe sex. Taking low doses of aspirin every day. Taking vitamin and mineral supplements as recommended by your health care provider. What happens during an annual well check? The services and screenings done by your health care provider during your annual well check will depend on your age, overall health, lifestyle risk factors, and family history of disease. Counseling  Your health care provider may ask you questions about your: Alcohol use. Tobacco use. Drug use. Emotional well-being. Home and relationship well-being. Sexual activity. Eating habits. History of falls. Memory and ability to understand (cognition). Work and work Astronomer. Screening  You  may have the following tests or measurements: Height, weight, and BMI. Blood pressure. Lipid and cholesterol levels. These may be checked every 5 years, or more frequently if you are over 71 years old. Skin check. Lung cancer screening. You may have this screening every year starting at age 57 if you have a 30-pack-year history of smoking and currently smoke or have quit within the past 15 years. Fecal occult blood test (FOBT) of the stool. You may have this test every year starting at age 6. Flexible sigmoidoscopy or colonoscopy. You may have a sigmoidoscopy every 5 years or a colonoscopy every 10 years starting at age 62. Prostate cancer screening. Recommendations will vary depending on your family history and other risks. Hepatitis C blood test. Hepatitis B blood test. Sexually transmitted disease (STD) testing. Diabetes screening. This is done by checking your blood sugar (glucose) after you have not eaten for a while (fasting). You may have this done every 1-3 years. Abdominal aortic aneurysm (AAA) screening. You may need this if you are a current or former smoker. Osteoporosis. You may be screened starting at age 36 if you are at high risk. Talk with your health care provider about your test results, treatment options, and if necessary, the need for more tests. Vaccines  Your health care provider may recommend certain vaccines, such as: Influenza vaccine. This is recommended every year. Tetanus, diphtheria, and acellular pertussis (Tdap, Td) vaccine. You may need a Td booster every 10 years. Zoster vaccine. You may need this after age 53. Pneumococcal 13-valent conjugate (PCV13) vaccine. One dose is recommended after age 17. Pneumococcal polysaccharide (PPSV23) vaccine. One dose is recommended after age 65. Talk to your health care provider about which screenings and vaccines you need and how often you need them. This information  is not intended to replace advice given to you by your  health care provider. Make sure you discuss any questions you have with your health care provider. Document Released: 09/08/2015 Document Revised: 05/01/2016 Document Reviewed: 06/13/2015 Elsevier Interactive Patient Education  2017 [place] Prevention in the Home Falls can cause injuries. They can happen to people of all ages. There are many things you can do to make your home safe and to help prevent falls. What can I do on the outside of my home? Regularly fix the edges of walkways and driveways and fix any cracks. Remove anything that might make you trip as you walk through a door, such as a raised step or threshold. Trim any bushes or trees on the path to your home. Use bright outdoor lighting. Clear any walking paths of anything that might make someone trip, such as rocks or tools. Regularly check to see if handrails are loose or broken. Make sure that both sides of any steps have handrails. Any raised decks and porches should have guardrails on the edges. Have any leaves, snow, or ice cleared regularly. Use sand or salt on walking paths during winter. Clean up any spills in your garage right away. This includes oil or grease spills. What can I do in the bathroom? Use night lights. Install grab bars by the toilet and in the tub and shower. Do not use towel bars as grab bars. Use non-skid mats or decals in the tub or shower. If you need to sit down in the shower, use a plastic, non-slip stool. Keep the floor dry. Clean up any water that spills on the floor as soon as it happens. Remove soap buildup in the tub or shower regularly. Attach bath mats securely with double-sided non-slip rug tape. Do not have throw rugs and other things on the floor that can make you trip. What can I do in the bedroom? Use night lights. Make sure that you have a light by your bed that is easy to reach. Do not use any sheets or blankets that are too big for your bed. They should not hang down  onto the floor. Have a firm chair that has side arms. You can use this for support while you get dressed. Do not have throw rugs and other things on the floor that can make you trip. What can I do in the kitchen? Clean up any spills right away. Avoid walking on wet floors. Keep items that you use a lot in easy-to-reach places. If you need to reach something above you, use a strong step stool that has a grab bar. Keep electrical cords out of the way. Do not use floor polish or wax that makes floors slippery. If you must use wax, use non-skid floor wax. Do not have throw rugs and other things on the floor that can make you trip. What can I do with my stairs? Do not leave any items on the stairs. Make sure that there are handrails on both sides of the stairs and use them. Fix handrails that are broken or loose. Make sure that handrails are as long as the stairways. Check any carpeting to make sure that it is firmly attached to the stairs. Fix any carpet that is loose or worn. Avoid having throw rugs at the top or bottom of the stairs. If you do have throw rugs, attach them to the floor with carpet tape. Make sure that you have a light switch at the top of  the stairs and the bottom of the stairs. If you do not have them, ask someone to add them for you. What else can I do to help prevent falls? Wear shoes that: Do not have high heels. Have rubber bottoms. Are comfortable and fit you well. Are closed at the toe. Do not wear sandals. If you use a stepladder: Make sure that it is fully opened. Do not climb a closed stepladder. Make sure that both sides of the stepladder are locked into place. Ask someone to hold it for you, if possible. Clearly mark and make sure that you can see: Any grab bars or handrails. First and last steps. Where the edge of each step is. Use tools that help you move around (mobility aids) if they are needed. These include: Canes. Walkers. Scooters. Crutches. Turn  on the lights when you go into a dark area. Replace any light bulbs as soon as they burn out. Set up your furniture so you have a clear path. Avoid moving your furniture around. If any of your floors are uneven, fix them. If there are any pets around you, be aware of where they are. Review your medicines with your doctor. Some medicines can make you feel dizzy. This can increase your chance of falling. Ask your doctor what other things that you can do to help prevent falls. This information is not intended to replace advice given to you by your health care provider. Make sure you discuss any questions you have with your health care provider. Document Released: 06/08/2009 Document Revised: 01/18/2016 Document Reviewed: 09/16/2014 Elsevier Interactive Patient Education  2017 [employer].

## 2022-04-08 ENCOUNTER — Encounter: Payer: Self-pay | Admitting: Nurse Practitioner

## 2022-04-08 ENCOUNTER — Ambulatory Visit: Payer: Self-pay | Admitting: *Deleted

## 2022-04-08 ENCOUNTER — Ambulatory Visit (INDEPENDENT_AMBULATORY_CARE_PROVIDER_SITE_OTHER): Payer: Medicare FFS | Admitting: Nurse Practitioner

## 2022-04-08 VITALS — BP 158/88 | HR 79 | Temp 97.5°F | Wt 202.3 lb

## 2022-04-08 DIAGNOSIS — R053 Chronic cough: Secondary | ICD-10-CM | POA: Insufficient documentation

## 2022-04-08 DIAGNOSIS — I10 Essential (primary) hypertension: Secondary | ICD-10-CM | POA: Diagnosis not present

## 2022-04-08 DIAGNOSIS — R600 Localized edema: Secondary | ICD-10-CM | POA: Diagnosis not present

## 2022-04-08 MED ORDER — AMOXICILLIN-POT CLAVULANATE 875-125 MG PO TABS: 1.0000 | ORAL_TABLET | Freq: Two times a day (BID) | ORAL | 0 refills | Status: AC

## 2022-04-08 MED ORDER — FLUTICASONE FUROATE-VILANTEROL 100-25 MCG/ACT IN AEPB: 1.0000 | INHALATION_SPRAY | Freq: Every day | RESPIRATORY_TRACT | 11 refills | Status: DC

## 2022-04-08 MED ORDER — LOSARTAN POTASSIUM 25 MG PO TABS: 25.0000 mg | ORAL_TABLET | Freq: Every day | ORAL | 1 refills | Status: DC

## 2022-04-08 MED ORDER — AMOXICILLIN-POT CLAVULANATE 875-125 MG PO TABS: 1.0000 | ORAL_TABLET | Freq: Two times a day (BID) | ORAL | 0 refills | Status: DC

## 2022-04-08 NOTE — Assessment & Plan Note (Signed)
Ongoing for 8 months since Covid per patient report.  Suspect some underlying lung disease with his smoking history, although he denies this as an issue.   - At this time will trial change to [facility] and stop Symbicort, suspect he may adhere to this better due to 1 puff a day -- educated him to rinse mouth out well after use. - Obtain CXR - Start Augmentin BID for 7 days, is currently on Prednisone with ortho - Cut back on smoking - Change from ACE to ARB, suspect some underlying lung disease present. - Obtain labs today to include: CBC, CMP, BNP, and Quantiferon ([place] risk [place], but wife and him both with cough) -- dependent on labs may need further testing or treatment.  Return in 2 weeks.

## 2022-04-08 NOTE — Progress Notes (Signed)
BP (!) 158/88 (BP Location: Left Arm, Patient Position: Sitting, Cuff Size: Normal)   Pulse 79   Temp (!) 97.5 F (36.4 C) (Oral)   Wt 202 lb 4.8 oz (91.8 kg)   SpO2 90%   BMI 30.31 kg/m    Subjective:    Patient ID: Connor Holmes, male    DOB: 07/23/1946, 75 y.o.   MRN: [id number]  HPI: Connor Holmes is a 76 y.o. male  Chief Complaint  Patient presents with   Edema    Bil feet swelling x 1 week.    Cough    For a while per patient.   HYPERTENSION & EDEMA Continues on Ramipril, in past was on HCTZ with this but did not tolerate this (made him feel bad) -- cardiology and GP in [place] told him to go off of this.  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 Hypertension status: uncontrolled  Satisfied with current treatment? yes Duration of hypertension: chronic BP monitoring frequency:  daily BP range: 120/60-70 range on average BP medication side effects:  no Medication compliance: good compliance Aspirin: no Recurrent headaches: no Visual changes: no Palpitations: no Dyspnea: no Chest pain: no Lower extremity edema: yes, noticed this one week ago -- no increase in salt in diet at the time -- was on Meloxicam for 3 weeks prior to edema appearing, stopped taking this recently and swelling has gotten 30 percent better thus far. Dizzy/lightheaded: no   COUGH Has been present for 8 months since Covid, using Symbicort only as needed at this time as does not like taste.  Not using as instructed.  Is a smoker, has smoked for 60 years.  Smokes between 1/2 to 1 PPD.  Is on Prednisone with ortho, past 2 days started.  Wife has had similar cough ongoing since Covid 8 months ago. Duration: months Circumstances of initial development of cough: Covid Cough severity: moderate Cough description: productive  Aggravating factors:  worse at night and in morning Alleviating factors: nothing Status:  fluctuating Treatments attempted: Symbicort Wheezing: yes Shortness of breath:  yes, strenuous activity only, denies orthopnea == sleeps on one pillow at night Chest pain: no Chest tightness:no Nasal congestion: yes Runny nose: yes Postnasal drip: yes Frequent throat clearing or swallowing: no Hemoptysis: no Fevers: no Night sweats: no Weight loss: no Heartburn: rarely Recent foreign travel:  lives [place] 7 months of year Tuberculosis contacts: no   Relevant past medical, surgical, family and social history reviewed and updated as indicated. Interim medical history since our last visit reviewed. Allergies and medications reviewed and updated.  Review of Systems  Constitutional:  Negative for activity change, diaphoresis, fatigue and fever.  Respiratory:  Positive for cough, shortness of breath (with strenuous activity) and wheezing. Negative for chest tightness.   Cardiovascular:  Positive for leg swelling. Negative for chest pain and palpitations.  Gastrointestinal: Negative.   Endocrine: Negative.   Neurological: Negative.   Psychiatric/Behavioral: Negative.      Per HPI unless specifically indicated above     Objective:    BP (!) 158/88 (BP Location: Left Arm, Patient Position: Sitting, Cuff Size: Normal)   Pulse 79   Temp (!) 97.5 F (36.4 C) (Oral)   Wt 202 lb 4.8 oz (91.8 kg)   SpO2 90%   BMI 30.31 kg/m   Wt Readings from Last 3 Encounters:  04/08/22 202 lb 4.8 oz (91.8 kg)  02/05/22 197 lb 6.4 oz (89.5 kg)  01/02/22 199 lb 6.4  oz (90.4 kg)    Physical Exam Vitals and nursing note reviewed.  Constitutional:      General: He is awake. He is not in acute distress.    Appearance: He is well-developed and well-groomed. He is obese. He is not ill-appearing or toxic-appearing.  HENT:     Head: Normocephalic and atraumatic.     Right Ear: Hearing normal. No drainage.     Left Ear: Hearing normal. No drainage.  Eyes:     General: Lids are normal.        Right eye: No discharge.        Left eye: No discharge.     Conjunctiva/sclera:  Conjunctivae normal.     Pupils: Pupils are equal, round, and reactive to light.  Neck:     Thyroid: No thyromegaly.     Vascular: No carotid bruit or JVD.  Cardiovascular:     Rate and Rhythm: Normal rate and regular rhythm.     Heart sounds: Normal heart sounds, S1 normal and S2 normal. No murmur heard.    No gallop.  Pulmonary:     Effort: Pulmonary effort is normal. No accessory muscle usage or respiratory distress.     Breath sounds: Wheezing present. No rhonchi.     Comments: Expiratory wheezes noted throughout lung fields. Abdominal:     General: Bowel sounds are normal.     Palpations: Abdomen is soft. There is no hepatomegaly or splenomegaly.  Musculoskeletal:        General: Normal range of motion.     Cervical back: Normal range of motion and neck supple.     Right lower leg: 2+ Edema present.     Left lower leg: 2+ Edema present.  Lymphadenopathy:     Cervical: No cervical adenopathy.  Skin:    General: Skin is warm and dry.     Capillary Refill: Capillary refill takes less than 2 seconds.  Neurological:     Mental Status: He is alert and oriented to person, place, and time.     Deep Tendon Reflexes: Reflexes are normal and symmetric.     Reflex Scores:      Brachioradialis reflexes are 2+ on the right side and 2+ on the left side.      Patellar reflexes are 2+ on the right side and 2+ on the left side. Psychiatric:        Attention and Perception: Attention normal.        Mood and Affect: Mood normal.        Speech: Speech normal.        Behavior: Behavior normal. Behavior is cooperative.        Thought Content: Thought content normal.    Results for orders placed or performed in visit on 02/05/22  PSA  Result Value Ref Range   Prostate Specific Ag, Serum 4.4 (H) 0.0 - 4.0 ng/mL  Basic metabolic panel  Result Value Ref Range   Glucose 108 (H) 70 - 99 mg/dL   BUN 18 8 - 27 mg/dL   Creatinine, Ser 1.27 0.76 - 1.27 mg/dL   eGFR 59 (L) >59 mL/min/1.73    BUN/Creatinine Ratio 14 10 - 24   Sodium 138 134 - 144 mmol/L   Potassium 4.9 3.5 - 5.2 mmol/L   Chloride 101 96 - 106 mmol/L   CO2 23 20 - 29 mmol/L   Calcium 10.0 8.6 - 10.2 mg/dL      Assessment & Plan:   Problem List Items  Addressed This Visit       Cardiovascular and Mediastinum   Primary hypertension    Chronic with BP not at goal today.  Suspect some underlying lung disease present due to smoking history.  At this time stop Ramipril (ACE) and change to Losartan 25 MG daily, increasing dose -- adjust further as needed.  Recommend he monitor BP at least a few mornings a week at home and document.  DASH diet at home.  Labs today: CBC, CMP, TSH.  Return in 2 weeks.      Relevant Medications   losartan (COZAAR) 25 MG tablet     Other   Bilateral leg edema    Over the past week noticed this, recently started on Meloxicam by ortho -- he has stopped this and noticing improvement in edema. BP is above goal today (refer to HTN plan).  No red flags on exam today.  Plan: - Will obtain CXR to check on lungs and heart - Labs today to include TSH, CBC, CMP, BNP - Recommend he remain of Meloxicam and take Tylenol if needed for pain - Focus on low sodium diet at home. - Elevated legs frequently during day and wear compression hose on during day and off at night.  - May benefit from echo and return to cardiology in future if ongoing. Return in 2 weeks.      Relevant Orders   CBC with Differential/Platelet   B Nat Peptide   Comprehensive metabolic panel   TSH   Chronic cough - Primary    Ongoing for 8 months since Covid per patient report.  Suspect some underlying lung disease with his smoking history, although he denies this as an issue.   - At this time will trial change to [facility] and stop Symbicort, suspect he may adhere to this better due to 1 puff a day -- educated him to rinse mouth out well after use. - Obtain CXR - Start Augmentin BID for 7 days, is currently on Prednisone with  ortho - Cut back on smoking - Change from ACE to ARB, suspect some underlying lung disease present. - Obtain labs today to include: CBC, CMP, BNP, and Quantiferon ([place] low risk [place], but wife and him both with cough) -- dependent on labs may need further testing or treatment.  Return in 2 weeks.      Relevant Orders   DG Chest 2 View   CBC with Differential/Platelet   B Nat Peptide   Comprehensive metabolic panel   QuantiFERON-TB Gold Plus     Follow up plan: Return in about 2 weeks (around 04/22/2022).

## 2022-04-08 NOTE — Telephone Encounter (Signed)
Summary: feet swelling   Pt stated his feet are swelling and he has been experiencing it for about a week. Pt requests return call to discuss. Cb#  [phone]        Chief Complaint: bilateral feet/ankles swelling, cough  Symptoms: see above. Reports pitting edema . Recently prescribed meloxicam for shoulder pain. Coughing up clear thick mucus.  Frequency: 1 week  Pertinent Negatives: Patient denies chest pain no difficulty breathing no fever. No swelling up to knees  Disposition: [] ED /[] Urgent Care (no appt availability in office) / [x] Appointment(In office/virtual)/ []  [facility]/ [] Home Care/ [] Refused Recommended Disposition /[] [facility]/ []  Follow-up with PCP Additional Notes:   na     Reason for Disposition  [1] MILD swelling of both ankles (i.e., pedal edema) AND [2] new-onset or worsening  Answer Assessment - Initial Assessment Questions 1. ONSET: "When did the swelling start?" (e.g., minutes, hours, days)     1 week ago  2. LOCATION: "What part of the leg is swollen?"  "Are both legs swollen or just one leg?"     Bilateral feet and ankles  3. SEVERITY: "How bad is the swelling?" (e.g., localized; mild, moderate, severe)   - Localized: Small area of swelling localized to one leg.   - MILD pedal edema: Swelling limited to foot and ankle, pitting edema < 1/4 inch (6 mm) deep, rest and elevation eliminate most or all swelling.   - MODERATE edema: Swelling of lower leg to knee, pitting edema > 1/4 inch (6 mm) deep, rest and elevation only partially reduce swelling.   - SEVERE edema: Swelling extends above knee, facial or hand swelling present.      Mild  4. REDNESS: "Does the swelling look red or infected?"     no 5. PAIN: "Is the swelling painful to touch?" If Yes, ask: "How painful is it?"   (Scale 1-10; mild, moderate or severe)     no 6. FEVER: "Do you have a fever?" If Yes, ask: "What is it, how was it measured, and when did it start?"       No no  7. CAUSE: "What do you think is causing the leg swelling?"     na 8. MEDICAL HISTORY: "Do you have a history of blood clots (e.g., DVT), cancer, heart failure, kidney disease, or liver failure?"     na 9. RECURRENT SYMPTOM: "Have you had leg swelling before?" If Yes, ask: "When was the last time?" "What happened that time?"     na 10. OTHER SYMPTOMS: "Do you have any other symptoms?" (e.g., chest pain, difficulty breathing)       Cough  11. PREGNANCY: "Is there any chance you are pregnant?" "When was your last menstrual period?"       na  Protocols used: Leg Swelling and Edema-A-AH

## 2022-04-08 NOTE — Assessment & Plan Note (Addendum)
Over the past week noticed this, recently started on Meloxicam by ortho -- he has stopped this and noticing improvement in edema. BP is above goal today (refer to HTN plan).  No red flags on exam today.  Plan: - Will obtain CXR to check on lungs and heart - Labs today to include TSH, CBC, CMP, BNP - Recommend he remain of Meloxicam and take Tylenol if needed for pain - Focus on low sodium diet at home. - Elevated legs frequently during day and wear compression hose on during day and off at night.  - May benefit from echo and return to cardiology in future if ongoing. Return in 2 weeks.

## 2022-04-08 NOTE — Patient Instructions (Addendum)
Stop Ramipril and start Losartan 25 MG when it arrives Stop Symbicort and start Breo when it arrives (1 puff daily).    Go for chest x-ray.   [facility] [address] [place],  [place]  [address] Phone: [phone]    Edema  Edema is when you have too much fluid in your body or under your skin. Edema may make your legs, feet, and ankles swell. Swelling often happens in looser tissues, such as around your eyes. This is a common condition. It gets more common as you get older. There are many possible causes of edema. These include: Eating too much salt (sodium). Being on your feet or sitting for a long time. Certain medical conditions, such as: Pregnancy. Heart failure. Liver disease. Kidney disease. Cancer. Hot weather may make edema worse. Edema is usually painless. Your skin may look swollen or shiny. Follow these instructions at home: Medicines Take over-the-counter and prescription medicines only as told by your doctor. Your doctor may prescribe a medicine to help your body get rid of extra water (diuretic). Take this medicine if you are told to take it. Eating and drinking Eat a low-salt (low-sodium) diet as told by your doctor. Sometimes, eating less salt may reduce swelling. Depending on the cause of your swelling, you may need to limit how much fluid you drink (fluid restriction). General instructions Raise the injured area above the level of your heart while you are sitting or lying down. Do not sit still or stand for a long time. Do not wear tight clothes. Do not wear garters on your upper legs. Exercise your legs. This can help the swelling go down. Wear compression stockings as told by your doctor. It is important that these are the right size. These should be prescribed by your doctor to prevent possible injuries. If elastic bandages or wraps are recommended, use them as told by your doctor. Contact a doctor if: Treatment is not  working. You have heart, liver, or kidney disease and have symptoms of edema. You have sudden and unexplained weight gain. Get help right away if: You have shortness of breath or chest pain. You cannot breathe when you lie down. You have pain, redness, or warmth in the swollen areas. You have heart, liver, or kidney disease and get edema all of a sudden. You have a fever and your symptoms get worse all of a sudden. These symptoms may be an emergency. Get help right away. Call 911. Do not wait to see if the symptoms will go away. Do not drive yourself to the hospital. Summary Edema is when you have too much fluid in your body or under your skin. Edema may make your legs, feet, and ankles swell. Swelling often happens in looser tissues, such as around your eyes. Raise the injured area above the level of your heart while you are sitting or lying down. Follow your doctor's instructions about diet and how much fluid you can drink. This information is not intended to replace advice given to you by your health care provider. Make sure you discuss any questions you have with your health care provider. Document Revised: 04/16/2021 Document Reviewed: 04/16/2021 Elsevier Patient Education  2023 [employer].

## 2022-04-08 NOTE — Assessment & Plan Note (Signed)
Chronic with BP not at goal today.  Suspect some underlying lung disease present due to smoking history.  At this time stop Ramipril (ACE) and change to Losartan 25 MG daily, increasing dose -- adjust further as needed.  Recommend he monitor BP at least a few mornings a week at home and document.  DASH diet at home.  Labs today: CBC, CMP, TSH.  Return in 2 weeks.

## 2022-04-09 ENCOUNTER — Ambulatory Visit
Admission: RE | Admit: 2022-04-09 | Discharge: 2022-04-09 | Disposition: A | Discharge status: Home / Self Care | Payer: Medicare FFS | Attending: Nurse Practitioner | Admitting: Nurse Practitioner

## 2022-04-09 ENCOUNTER — Other Ambulatory Visit: Payer: Medicare FFS

## 2022-04-09 ENCOUNTER — Ambulatory Visit
Admission: RE | Admit: 2022-04-09 | Discharge: 2022-04-09 | Disposition: A | Discharge status: Home / Self Care | Payer: Medicare FFS | Source: Ambulatory Visit | Attending: Nurse Practitioner | Admitting: Nurse Practitioner

## 2022-04-09 DIAGNOSIS — R053 Chronic cough: Secondary | ICD-10-CM | POA: Insufficient documentation

## 2022-04-09 DIAGNOSIS — R600 Localized edema: Secondary | ICD-10-CM

## 2022-04-10 NOTE — Progress Notes (Signed)
Good afternoon, please let Connor Holmes know his labs and imaging have returned: - Kidney function, creatinine and eGFR, remains normal, as is liver function, AST and ALT.   - CBC is showing mild elevation in white blood cell count and neutrophils which could be related to infection with cough.  Imaging did not show pneumonia, but did show lung changes from smoking. - Thyroid is normal. - Waiting on TB blood work and heart check lab to return, will alert you when these return.  Continue current treatment plan at this time.  Have an amazing day!!

## 2022-04-12 ENCOUNTER — Other Ambulatory Visit: Payer: Self-pay | Admitting: Nurse Practitioner

## 2022-04-12 DIAGNOSIS — R053 Chronic cough: Secondary | ICD-10-CM

## 2022-04-12 LAB — CBC WITH DIFFERENTIAL/PLATELET
Basophils Absolute: 0.1 10*3/uL (ref 0.0–0.2)
Basos: 0 %
EOS (ABSOLUTE): 0 10*3/uL (ref 0.0–0.4)
Eos: 0 %
Hematocrit: 55.1 % — ABNORMAL HIGH (ref 37.5–51.0)
Hemoglobin: 18 g/dL — ABNORMAL HIGH (ref 13.0–17.7)
Immature Grans (Abs): 0.1 10*3/uL (ref 0.0–0.1)
Immature Granulocytes: 1 %
Lymphocytes Absolute: 1.2 10*3/uL (ref 0.7–3.1)
Lymphs: 10 %
MCH: 30.6 pg (ref 26.6–33.0)
MCHC: 32.7 g/dL (ref 31.5–35.7)
MCV: 94 fL (ref 79–97)
Monocytes Absolute: 0.5 10*3/uL (ref 0.1–0.9)
Monocytes: 4 %
Neutrophils Absolute: 10.4 10*3/uL — ABNORMAL HIGH (ref 1.4–7.0)
Neutrophils: 85 %
Platelets: 188 10*3/uL (ref 150–450)
RBC: 5.89 x10E6/uL — ABNORMAL HIGH (ref 4.14–5.80)
RDW: 12.5 % (ref 11.6–15.4)
WBC: 12.2 10*3/uL — ABNORMAL HIGH (ref 3.4–10.8)

## 2022-04-12 LAB — COMPREHENSIVE METABOLIC PANEL
ALT: 19 IU/L (ref 0–44)
AST: 13 IU/L (ref 0–40)
Albumin/Globulin Ratio: 1.7 (ref 1.2–2.2)
Albumin: 4.5 g/dL (ref 3.8–4.8)
Alkaline Phosphatase: 76 IU/L (ref 44–121)
BUN/Creatinine Ratio: 20 (ref 10–24)
BUN: 23 mg/dL (ref 8–27)
Bilirubin Total: 0.4 mg/dL (ref 0.0–1.2)
CO2: 23 mmol/L (ref 20–29)
Calcium: 9.4 mg/dL (ref 8.6–10.2)
Chloride: 104 mmol/L (ref 96–106)
Creatinine, Ser: 1.16 mg/dL (ref 0.76–1.27)
Globulin, Total: 2.7 g/dL (ref 1.5–4.5)
Glucose: 137 mg/dL — ABNORMAL HIGH (ref 70–99)
Potassium: 4.9 mmol/L (ref 3.5–5.2)
Sodium: 139 mmol/L (ref 134–144)
Total Protein: 7.2 g/dL (ref 6.0–8.5)
eGFR: 65 mL/min/{1.73_m2} (ref >59)

## 2022-04-12 LAB — TSH: TSH: 2.18 u[IU]/mL (ref 0.450–4.500)

## 2022-04-12 LAB — QUANTIFERON-TB GOLD PLUS
QuantiFERON Mitogen Value: 0.19 IU/mL
QuantiFERON Nil Value: 0.02 IU/mL
QuantiFERON TB1 Ag Value: 0 IU/mL
QuantiFERON TB2 Ag Value: 0 IU/mL
QuantiFERON-TB Gold Plus: UNDETERMINED — AB

## 2022-04-12 LAB — BRAIN NATRIURETIC PEPTIDE: BNP: 22.6 pg/mL (ref 0.0–100.0)

## 2022-04-12 NOTE — Progress Notes (Signed)
Good afternoon, please let patient know his TB (tuberculosis) blood test returned indeterminate.  This could be related blood draw itself or time in lab -- I would like to repeat this via outpatient labs next week.  His lung imaging shows no signs concerning for TB, but would benefit from repeat lab to ensure no issues present.  Please schedule:)  Thank you:)

## 2022-04-15 ENCOUNTER — Ambulatory Visit: Payer: Self-pay | Admitting: *Deleted

## 2022-04-15 NOTE — Telephone Encounter (Signed)
Addressed in result note and routed to practice

## 2022-04-15 NOTE — Telephone Encounter (Signed)
Error

## 2022-04-22 ENCOUNTER — Encounter: Payer: Self-pay | Admitting: Family Medicine

## 2022-04-22 ENCOUNTER — Ambulatory Visit (INDEPENDENT_AMBULATORY_CARE_PROVIDER_SITE_OTHER): Payer: Medicare FFS | Admitting: Family Medicine

## 2022-04-22 VITALS — BP 132/81 | HR 86 | Temp 97.9°F | Wt 198.7 lb

## 2022-04-22 DIAGNOSIS — I10 Essential (primary) hypertension: Secondary | ICD-10-CM | POA: Diagnosis not present

## 2022-04-22 DIAGNOSIS — Z111 Encounter for screening for respiratory tuberculosis: Secondary | ICD-10-CM

## 2022-04-22 DIAGNOSIS — R053 Chronic cough: Secondary | ICD-10-CM

## 2022-04-22 DIAGNOSIS — R600 Localized edema: Secondary | ICD-10-CM | POA: Diagnosis not present

## 2022-04-22 DIAGNOSIS — J410 Simple chronic bronchitis: Secondary | ICD-10-CM

## 2022-04-22 MED ORDER — HYDROCHLOROTHIAZIDE 25 MG PO TABS: 25.0000 mg | ORAL_TABLET | Freq: Every day | ORAL | 1 refills | Status: DC | PRN

## 2022-04-22 MED ORDER — BUDESONIDE-FORMOTEROL FUMARATE 160-4.5 MCG/ACT IN AERO: 2.0000 | INHALATION_SPRAY | Freq: Two times a day (BID) | RESPIRATORY_TRACT | 3 refills | Status: DC

## 2022-04-22 MED ORDER — BREZTRI AEROSPHERE 160-9-4.8 MCG/ACT IN AERO: 2.0000 | INHALATION_SPRAY | Freq: Two times a day (BID) | RESPIRATORY_TRACT | 11 refills | Status: DC

## 2022-04-22 NOTE — Progress Notes (Unsigned)
BP 132/81   Pulse 86   Temp 97.9 F (36.6 C)   Wt 198 lb 11.2 oz (90.1 kg)   BMI 29.77 kg/m    Subjective:    Patient ID: Connor Holmes, male    DOB: December 02, 1945, 76 y.o.   MRN: [id number]  HPI: Connor Holmes is a 76 y.o. male  Chief Complaint  Patient presents with   Cough   Hypertension   Leg Swelling    Patient states he no longer has swelling in his legs   Swelling is gone. Came back after 3 days, for 1 day, gone again now. Restarted his HCTZ that he had at home. Cough is not any better with the change in medicine.   HYPERTENSION Hypertension status: controlled  Satisfied with current treatment? yes Duration of hypertension: chronic BP monitoring frequency:  not checking BP medication side effects:  no Medication compliance: good compliance Previous BP meds: losartan, HCTZ Aspirin: no Recurrent headaches: no Visual changes: no Palpitations: no Dyspnea: no Chest pain: no Lower extremity edema: no Dizzy/lightheaded: no  Relevant past medical, surgical, family and social history reviewed and updated as indicated. Interim medical history since our last visit reviewed. Allergies and medications reviewed and updated.  Review of Systems  Constitutional: Negative.   Respiratory:  Positive for cough. Negative for apnea, choking, chest tightness, shortness of breath, wheezing and stridor.   Cardiovascular: Negative.   Gastrointestinal: Negative.   Genitourinary: Negative.   Musculoskeletal: Negative.   Neurological: Negative.   Psychiatric/Behavioral: Negative.      Per HPI unless specifically indicated above     Objective:    BP 132/81   Pulse 86   Temp 97.9 F (36.6 C)   Wt 198 lb 11.2 oz (90.1 kg)   BMI 29.77 kg/m   Wt Readings from Last 3 Encounters:  04/22/22 198 lb 11.2 oz (90.1 kg)  04/08/22 202 lb 4.8 oz (91.8 kg)  02/05/22 197 lb 6.4 oz (89.5 kg)    Physical Exam Vitals and nursing note reviewed.  Constitutional:      General: He is not in  acute distress.    Appearance: Normal appearance. He is not ill-appearing, toxic-appearing or diaphoretic.  HENT:     Head: Normocephalic and atraumatic.     Right Ear: External ear normal.     Left Ear: External ear normal.     Nose: Nose normal.     Mouth/Throat:     Mouth: Mucous membranes are moist.     Pharynx: Oropharynx is clear.  Eyes:     General: No scleral icterus.       Right eye: No discharge.        Left eye: No discharge.     Extraocular Movements: Extraocular movements intact.     Conjunctiva/sclera: Conjunctivae normal.     Pupils: Pupils are equal, round, and reactive to light.  Cardiovascular:     Rate and Rhythm: Normal rate and regular rhythm.     Pulses: Normal pulses.     Heart sounds: Normal heart sounds. No murmur heard.    No friction rub. No gallop.  Pulmonary:     Effort: Pulmonary effort is normal. No respiratory distress.     Breath sounds: Normal breath sounds. No stridor. No wheezing, rhonchi or rales.  Chest:     Chest wall: No tenderness.  Musculoskeletal:        General: Normal range of motion.     Cervical back: Normal range of motion and neck  supple.  Skin:    General: Skin is warm and dry.     Capillary Refill: Capillary refill takes less than 2 seconds.     Coloration: Skin is not jaundiced or pale.     Findings: No bruising, erythema, lesion or rash.  Neurological:     General: No focal deficit present.     Mental Status: He is alert and oriented to person, place, and time. Mental status is at baseline.  Psychiatric:        Mood and Affect: Mood normal.        Behavior: Behavior normal.        Thought Content: Thought content normal.        Judgment: Judgment normal.     Results for orders placed or performed in visit on 04/22/22  QuantiFERON-TB Gold Plus  Result Value Ref Range   QuantiFERON Incubation WILL FOLLOW    QuantiFERON Criteria WILL FOLLOW    QuantiFERON TB1 Ag Value WILL FOLLOW    QuantiFERON TB2 Ag Value WILL  FOLLOW    QuantiFERON Nil Value WILL FOLLOW    QuantiFERON Mitogen Value WILL FOLLOW    QuantiFERON-TB Gold Plus WILL FOLLOW   Basic metabolic panel  Result Value Ref Range   Glucose 92 70 - 99 mg/dL   BUN 23 8 - 27 mg/dL   Creatinine, Ser 1.27 0.76 - 1.27 mg/dL   eGFR 59 (L) >59 mL/min/1.73   BUN/Creatinine Ratio 18 10 - 24   Sodium 140 134 - 144 mmol/L   Potassium 5.0 3.5 - 5.2 mmol/L   Chloride 100 96 - 106 mmol/L   CO2 23 20 - 29 mmol/L   Calcium 9.6 8.6 - 10.2 mg/dL      Assessment & Plan:   Problem List Items Addressed This Visit       Cardiovascular and Mediastinum   Primary hypertension    Under good control on current regimen. Continue current regimen. Continue to monitor. Call with any concerns. Refills given.  Labs drawn today.        Relevant Medications   hydrochlorothiazide (HYDRODIURIL) 25 MG tablet   Other Relevant Orders   Basic metabolic panel (Completed)     Respiratory   Simple chronic bronchitis (HCC) - Primary    Cough no better with change off ACE. Arlyce Harman suggests some bronchitis. Did not like trelegy. Will change to breztri and recheck 1 month. Call with any concerns.         Other   Bilateral leg edema    Resolved. Continue to monitor.      Chronic cough    Cough no better with change off ACE. Arlyce Harman suggests some bronchitis. Did not like trelegy. Will change to breztri and recheck 1 month. Call with any concerns.       Relevant Orders   Spirometry with graph (Completed)   QuantiFERON-TB Gold Plus (Completed)   Other Visit Diagnoses     Screening for tuberculosis       Equivocal quantiferon last visit. CXR normal. Will recheck and refer to health department if needed.    Relevant Orders   QuantiFERON-TB Gold Plus (Completed)        Follow up plan: Return in about 4 weeks (around 05/20/2022).

## 2022-04-24 NOTE — Assessment & Plan Note (Signed)
Cough no better with change off ACE. Spiro suggests some bronchitis. Did not like trelegy. Will change to breztri and recheck 1 month. Call with any concerns.  

## 2022-04-24 NOTE — Assessment & Plan Note (Signed)
Resolved.  Continue to monitor.

## 2022-04-24 NOTE — Assessment & Plan Note (Signed)
Under good control on current regimen. Continue current regimen. Continue to monitor. Call with any concerns. Refills given. Labs drawn today.   

## 2022-04-24 NOTE — Assessment & Plan Note (Signed)
Cough no better with change off ACE. Cleda Daub suggests some bronchitis. Did not like trelegy. Will change to breztri and recheck 1 month. Call with any concerns.

## 2022-04-25 LAB — BASIC METABOLIC PANEL
BUN/Creatinine Ratio: 18 (ref 10–24)
BUN: 23 mg/dL (ref 8–27)
CO2: 23 mmol/L (ref 20–29)
Calcium: 9.6 mg/dL (ref 8.6–10.2)
Chloride: 100 mmol/L (ref 96–106)
Creatinine, Ser: 1.27 mg/dL (ref 0.76–1.27)
Glucose: 92 mg/dL (ref 70–99)
Potassium: 5 mmol/L (ref 3.5–5.2)
Sodium: 140 mmol/L (ref 134–144)
eGFR: 59 mL/min/{1.73_m2} — ABNORMAL LOW (ref >59)

## 2022-04-25 LAB — QUANTIFERON-TB GOLD PLUS
QuantiFERON Mitogen Value: 3.35 IU/mL
QuantiFERON Nil Value: 0.02 IU/mL
QuantiFERON TB1 Ag Value: 0.01 IU/mL
QuantiFERON TB2 Ag Value: 0.03 IU/mL
QuantiFERON-TB Gold Plus: NEGATIVE

## 2022-04-25 NOTE — Progress Notes (Signed)
Contacted via MyChart   Much better Connor Holmes, your TB testing is negative:)

## 2022-05-24 ENCOUNTER — Ambulatory Visit (INDEPENDENT_AMBULATORY_CARE_PROVIDER_SITE_OTHER): Payer: Medicare FFS | Admitting: Family Medicine

## 2022-05-24 ENCOUNTER — Encounter: Payer: Self-pay | Admitting: Family Medicine

## 2022-05-24 VITALS — BP 132/82 | HR 59 | Temp 97.7°F | Ht 68.5 in | Wt 204.7 lb

## 2022-05-24 DIAGNOSIS — R053 Chronic cough: Secondary | ICD-10-CM

## 2022-05-24 DIAGNOSIS — Z0000 Encounter for general adult medical examination without abnormal findings: Secondary | ICD-10-CM | POA: Diagnosis not present

## 2022-05-24 DIAGNOSIS — R7301 Impaired fasting glucose: Secondary | ICD-10-CM

## 2022-05-24 DIAGNOSIS — J410 Simple chronic bronchitis: Secondary | ICD-10-CM

## 2022-05-24 DIAGNOSIS — I10 Essential (primary) hypertension: Secondary | ICD-10-CM

## 2022-05-24 DIAGNOSIS — E782 Mixed hyperlipidemia: Secondary | ICD-10-CM

## 2022-05-24 DIAGNOSIS — Z23 Encounter for immunization: Secondary | ICD-10-CM | POA: Diagnosis not present

## 2022-05-24 DIAGNOSIS — S41101A Unspecified open wound of right upper arm, initial encounter: Secondary | ICD-10-CM | POA: Diagnosis not present

## 2022-05-24 LAB — MICROALBUMIN, URINE WAIVED
Creatinine, Urine Waived: 100 mg/dL (ref 10–300)
Microalb, Ur Waived: 10 mg/L (ref 0–19)
Microalb/Creat Ratio: <30 mg/g (ref <30)

## 2022-05-24 LAB — URINALYSIS, ROUTINE W REFLEX MICROSCOPIC
Bilirubin, UA: NEGATIVE
Glucose, UA: NEGATIVE
Ketones, UA: NEGATIVE
Leukocytes,UA: NEGATIVE
Nitrite, UA: NEGATIVE
Protein,UA: NEGATIVE
RBC, UA: NEGATIVE
Specific Gravity, UA: 1.02 (ref 1.005–1.030)
Urobilinogen, Ur: 0.2 mg/dL (ref 0.2–1.0)
pH, UA: 6 (ref 5.0–7.5)

## 2022-05-24 LAB — BAYER DCA HB A1C WAIVED: HB A1C (BAYER DCA - WAIVED): 6 % — ABNORMAL HIGH (ref 4.8–5.6)

## 2022-05-24 MED ORDER — BREZTRI AEROSPHERE 160-9-4.8 MCG/ACT IN AERO: 2.0000 | INHALATION_SPRAY | Freq: Two times a day (BID) | RESPIRATORY_TRACT | 11 refills | Status: DC

## 2022-05-24 NOTE — Assessment & Plan Note (Signed)
Under good control on current regimen. Continue current regimen. Continue to monitor. Call with any concerns. Refills up to date. Will send in next week so they can get to [place]. Labs drawn today.

## 2022-05-24 NOTE — Assessment & Plan Note (Signed)
Cough no better on breztri. Will continue it and get him into pulmonology for evaluation when he returns from [place].

## 2022-05-24 NOTE — Progress Notes (Signed)
BP 132/82   Pulse (!) 59   Temp 97.7 F (36.5 C) (Oral)   Ht 5' 8.5" (1.74 m)   Wt 204 lb 11.2 oz (92.9 kg)   SpO2 100%   BMI 30.67 kg/m    Subjective:    Patient ID: Connor Holmes, male    DOB: Dec 27, 1945, 76 y.o.   MRN: [id number]  HPI: Connor Holmes is a 76 y.o. male presenting on 05/24/2022 for comprehensive medical examination. Current medical complaints include:  HYPERTENSION / HYPERLIPIDEMIA Satisfied with current treatment? yes Duration of hypertension: chronic BP monitoring frequency: not checking BP medication side effects: no Past BP meds: losartan, HCTZ Duration of hyperlipidemia: chronic Cholesterol medication side effects: no Cholesterol supplements: none Past cholesterol medications: atorvastatin Medication compliance: excellent compliance Aspirin: no Recent stressors: no Recurrent headaches: no Visual changes: no Palpitations: no Dyspnea: no Chest pain: no Lower extremity edema: no Dizzy/lightheaded: no  CHRONIC COUGH Duration:  chronic Circumstances of initial development of cough: unknown Cough severity: moderate Cough description: non-productive, hacking, and dry Aggravating factors:  unknown Alleviating factors: unknown Status:  stable Treatments attempted:  inhalers, cold/sinus, mucinex, cough syrup, antibiotics, albuterol, and nasal CCS Wheezing: no Shortness of breath: no Chest pain: no Chest tightness:no Nasal congestion: no Runny nose: no Postnasal drip: no Frequent throat clearing or swallowing: no Hemoptysis: no Fevers: no Night sweats: no Weight loss: no Heartburn: no Recent foreign travel: yes- spends 6 months of the year in [place] Tuberculosis contacts: no  He currently lives with: wife Interim Problems from his last visit: no  Depression Screen done today and results listed below:     04/22/2022   10:32 AM 04/08/2022    4:09 PM 02/05/2022    9:19 AM 01/02/2022    9:21 AM  Depression screen PHQ 2/9  Decreased  Interest 0 0 0 0  Down, Depressed, Hopeless 0 0 0 0  PHQ - 2 Score 0 0 0 0  Altered sleeping 0 3 0 3  Tired, decreased energy 0 3 0 3  Change in appetite 0 0 0 3  Feeling bad or failure about yourself  0 0 0 0  Trouble concentrating 0 0 0 0  Moving slowly or fidgety/restless 0 0 0 0  Suicidal thoughts 0 0 0 0  PHQ-9 Score 0 6 0 9  Difficult doing work/chores  Not difficult at all Not difficult at all     Past Medical History:  Past Medical History:  Diagnosis Date   Arthritis    Hypertension     Surgical History:  Past Surgical History:  Procedure Laterality Date   ABDOMINAL AORTA STENT     AMPUTATION FINGER     SHOULDER SURGERY Left     Medications:  Current Outpatient Medications on File Prior to Visit  Medication Sig   atorvastatin (LIPITOR) 40 MG tablet Take 1 tablet (40 mg total) by mouth daily.   cetirizine (ZYRTEC) 10 MG tablet Take 1 tablet (10 mg total) by mouth daily.   fluticasone (FLONASE) 50 MCG/ACT nasal spray Place into both nostrils daily.   hydrochlorothiazide (HYDRODIURIL) 25 MG tablet Take 1 tablet (25 mg total) by mouth daily as needed (swelling).   losartan (COZAAR) 25 MG tablet Take 1 tablet (25 mg total) by mouth daily.   No current facility-administered medications on file prior to visit.    Allergies:  Allergies  Allergen Reactions   Other Nausea And Vomiting    Patient states he is allergic to all opioids, he  gets very sick.    Social History:  Social History   Socioeconomic History   Marital status: Married    Spouse name: Ana (LuLu)   Number of children: Not on file   Years of education: Not on file   Highest education level: Not on file  Occupational History   Not on file  Tobacco Use   Smoking status: Every Day    Packs/day: 1.00    Types: Cigarettes   Smokeless tobacco: Never  Vaping Use   Vaping Use: Never used  Substance and Sexual Activity   Alcohol use: Yes    Alcohol/week: 1.0 standard drink of alcohol    Types:  1 Shots of liquor per week   Drug use: Never   Sexual activity: Not Currently  Other Topics Concern   Not on file  Social History Narrative   Not on file   Social Determinants of Health   Financial Resource Strain: Low Risk  (03/20/2022)   Overall Financial Resource Strain (CARDIA)    Difficulty of Paying Living Expenses: Not hard at all  Food Insecurity: No Food Insecurity (03/20/2022)   Hunger Vital Sign    Worried About Running Out of Food in the Last Year: Never true    Ran Out of Food in the Last Year: Never true  Transportation Needs: No Transportation Needs (03/20/2022)   PRAPARE - Administrator, Civil Service (Medical): No    Lack of Transportation (Non-Medical): No  Physical Activity: Inactive (03/20/2022)   Exercise Vital Sign    Days of Exercise per Week: 0 days    Minutes of Exercise per Session: 0 min  Stress: No Stress Concern Present (03/20/2022)   [employer] of Occupational Health - Occupational Stress Questionnaire    Feeling of Stress : Not at all  Social Connections: Socially Integrated (03/20/2022)   Social Connection and Isolation Panel [NHANES]    Frequency of Communication with Friends and Family: Twice a week    Frequency of Social Gatherings with Friends and Family: More than three times a week    Attends Religious Services: More than 4 times per year    Active Member of [employer] or Organizations: Yes    Attends Engineer, structural: More than 4 times per year    Marital Status: Married  Catering manager Violence: Not At Risk (03/20/2022)   Humiliation, Afraid, Rape, and Kick questionnaire    Fear of Current or Ex-Partner: No    Emotionally Abused: No    Physically Abused: No    Sexually Abused: No   Social History   Tobacco Use  Smoking Status Every Day   Packs/day: 1.00   Types: Cigarettes  Smokeless Tobacco Never   Social History   Substance and Sexual Activity  Alcohol Use Yes   Alcohol/week: 1.0 standard drink of  alcohol   Types: 1 Shots of liquor per week    Family History:  Family History  Problem Relation Age of Onset   Alzheimer's disease Mother    Stroke Father     Past medical history, surgical history, medications, allergies, family history and social history reviewed with patient today and changes made to appropriate areas of the chart.   Review of Systems  Constitutional: Negative.   HENT: Negative.    Eyes: Negative.   Respiratory:  Positive for cough. Negative for hemoptysis, sputum production, shortness of breath and wheezing.   Cardiovascular: Negative.   Gastrointestinal:  Positive for heartburn (1x every 2 months). Negative  for abdominal pain, blood in stool, constipation, diarrhea, melena, nausea and vomiting.  Genitourinary: Negative.   Musculoskeletal: Negative.   Skin: Negative.   Neurological: Negative.   Endo/Heme/Allergies:  Positive for environmental allergies and polydipsia. Does not bruise/bleed easily.  Psychiatric/Behavioral: Negative.     All other ROS negative except what is listed above and in the HPI.      Objective:    BP 132/82   Pulse (!) 59   Temp 97.7 F (36.5 C) (Oral)   Ht 5' 8.5" (1.74 m)   Wt 204 lb 11.2 oz (92.9 kg)   SpO2 100%   BMI 30.67 kg/m   Wt Readings from Last 3 Encounters:  05/24/22 204 lb 11.2 oz (92.9 kg)  04/22/22 198 lb 11.2 oz (90.1 kg)  04/08/22 202 lb 4.8 oz (91.8 kg)    Physical Exam Vitals and nursing note reviewed.  Constitutional:      General: He is not in acute distress.    Appearance: Normal appearance. He is obese. He is not ill-appearing, toxic-appearing or diaphoretic.  HENT:     Head: Normocephalic and atraumatic.     Right Ear: Tympanic membrane, ear canal and external ear normal. There is no impacted cerumen.     Left Ear: Tympanic membrane, ear canal and external ear normal. There is no impacted cerumen.     Nose: Nose normal. No congestion or rhinorrhea.     Mouth/Throat:     Mouth: Mucous  membranes are moist.     Pharynx: Oropharynx is clear. No oropharyngeal exudate or posterior oropharyngeal erythema.  Eyes:     General: No scleral icterus.       Right eye: No discharge.        Left eye: No discharge.     Extraocular Movements: Extraocular movements intact.     Conjunctiva/sclera: Conjunctivae normal.     Pupils: Pupils are equal, round, and reactive to light.  Neck:     Vascular: No carotid bruit.  Cardiovascular:     Rate and Rhythm: Normal rate and regular rhythm.     Pulses: Normal pulses.     Heart sounds: No murmur heard.    No friction rub. No gallop.  Pulmonary:     Effort: Pulmonary effort is normal. No respiratory distress.     Breath sounds: Normal breath sounds. No stridor. No wheezing, rhonchi or rales.  Chest:     Chest wall: No tenderness.  Abdominal:     General: Abdomen is flat. Bowel sounds are normal. There is no distension.     Palpations: Abdomen is soft. There is no mass.     Tenderness: There is no abdominal tenderness. There is no right CVA tenderness, left CVA tenderness, guarding or rebound.     Hernia: No hernia is present.  Genitourinary:    Comments: Genital exam deferred with shared decision making Musculoskeletal:        General: No swelling, tenderness, deformity or signs of injury.     Cervical back: Normal range of motion and neck supple. No rigidity. No muscular tenderness.     Right lower leg: No edema.     Left lower leg: No edema.  Lymphadenopathy:     Cervical: No cervical adenopathy.  Skin:    General: Skin is warm and dry.     Capillary Refill: Capillary refill takes less than 2 seconds.     Coloration: Skin is not jaundiced or pale.     Findings: No bruising, erythema, lesion  or rash.  Neurological:     General: No focal deficit present.     Mental Status: He is alert and oriented to person, place, and time.     Cranial Nerves: No cranial nerve deficit.     Sensory: No sensory deficit.     Motor: No weakness.      Coordination: Coordination normal.     Gait: Gait normal.     Deep Tendon Reflexes: Reflexes normal.  Psychiatric:        Mood and Affect: Mood normal.        Behavior: Behavior normal.        Thought Content: Thought content normal.        Judgment: Judgment normal.     Results for orders placed or performed in visit on 05/24/22  Urinalysis, Routine w reflex microscopic  Result Value Ref Range   Specific Gravity, UA 1.020 1.005 - 1.030   pH, UA 6.0 5.0 - 7.5   Color, UA Yellow Yellow   Appearance Ur Clear Clear   Leukocytes,UA Negative Negative   Protein,UA Negative Negative/Trace   Glucose, UA Negative Negative   Ketones, UA Negative Negative   RBC, UA Negative Negative   Bilirubin, UA Negative Negative   Urobilinogen, Ur 0.2 0.2 - 1.0 mg/dL   Nitrite, UA Negative Negative  Microalbumin, Urine Waived  Result Value Ref Range   Microalb, Ur Waived 10 0 - 19 mg/L   Creatinine, Urine Waived 100 10 - 300 mg/dL   Microalb/Creat Ratio <30 <30 mg/g  Bayer DCA Hb A1c Waived  Result Value Ref Range   HB A1C (BAYER DCA - WAIVED) 6.0 (H) 4.8 - 5.6 %      Assessment & Plan:   Problem List Items Addressed This Visit       Cardiovascular and Mediastinum   Primary hypertension    Under good control on current regimen. Continue current regimen. Continue to monitor. Call with any concerns. Refills up to date. Will send in next week so they can get to [place]. Labs drawn today.       Relevant Orders   Comprehensive metabolic panel   CBC with Differential/Platelet   TSH   Urinalysis, Routine w reflex microscopic (Completed)   Microalbumin, Urine Waived (Completed)     Respiratory   Simple chronic bronchitis (HCC)    Cough no better on breztri. Will continue it and get him into pulmonology for evaluation when he returns from [place].         Endocrine   IFG (impaired fasting glucose)    Stable with A1c of 6.0. Continue current regimen. Continue to monitor. Call  with any concerns.       Relevant Orders   Comprehensive metabolic panel   CBC with Differential/Platelet   Urinalysis, Routine w reflex microscopic (Completed)   Microalbumin, Urine Waived (Completed)   Bayer DCA Hb A1c Waived (Completed)     Other   Mixed hyperlipidemia    Under good control on current regimen. Continue current regimen. Continue to monitor. Call with any concerns. Refills up to date. Will send in next week so they can get to [place]. Labs drawn today.      Relevant Orders   Comprehensive metabolic panel   CBC with Differential/Platelet   Lipid Panel w/o Chol/HDL Ratio   Chronic cough    Cough no better on breztri. Will continue it and get him into pulmonology for evaluation when he returns from [place].  Relevant Orders   Ambulatory referral to Pulmonology   Other Visit Diagnoses     Routine general medical examination at a health care facility    -  Primary   Vaccines updated. Screening labs checked today. Continue diet and exercise. Call with any concerns.    Need for influenza vaccination       Flu shot given today.   Relevant Orders   Flu Vaccine QUAD High Dose(Fluad) (Completed)   Open wound of right upper arm, initial encounter       Consistent with scratch. Healing well. Due for Td. Given today.   Relevant Orders   Td : Tetanus/diphtheria >7yo Preservative  free (Completed)   Need for vaccination for pneumococcus       Prevnar 20 given today.   Relevant Orders   Pneumococcal conjugate vaccine 20-valent (Prevnar 20) (Completed)       LABORATORY TESTING:  Health maintenance labs ordered today as discussed above.   IMMUNIZATIONS:   - Tdap: Tetanus vaccination status reviewed: Td vaccination indicated and given today. - Influenza: Administered today - Pneumovax: Not applicable - Prevnar: Administered today - COVID: Refused - HPV: Not applicable - Shingrix vaccine: Refused  PATIENT COUNSELING:    Sexuality: Discussed  sexually transmitted diseases, partner selection, use of condoms, avoidance of unintended pregnancy  and contraceptive alternatives.   Advised to avoid cigarette smoking.  I discussed with the patient that most people either abstain from alcohol or drink within safe limits (<=14/week and <=4 drinks/occasion for males, <=7/weeks and <= 3 drinks/occasion for females) and that the risk for alcohol disorders and other health effects rises proportionally with the number of drinks per week and how often a drinker exceeds daily limits.  Discussed cessation/primary prevention of drug use and availability of treatment for abuse.   Diet: Encouraged to adjust caloric intake to maintain  or achieve ideal body weight, to reduce intake of dietary saturated fat and total fat, to limit sodium intake by avoiding high sodium foods and not adding table salt, and to maintain adequate dietary potassium and calcium preferably from fresh fruits, vegetables, and low-fat dairy products.    stressed the importance of regular exercise  Injury prevention: Discussed safety belts, safety helmets, smoke detector, smoking near bedding or upholstery.   Dental health: Discussed importance of regular tooth brushing, flossing, and dental visits.   Follow up plan: NEXT PREVENTATIVE PHYSICAL DUE IN 1 YEAR. Return for May when he gets back from [place].

## 2022-05-24 NOTE — Assessment & Plan Note (Signed)
Under good control on current regimen. Continue current regimen. Continue to monitor. Call with any concerns. Refills up to date. Will send in next week so they can get to [place]. Labs drawn today.  

## 2022-05-24 NOTE — Assessment & Plan Note (Signed)
Stable with A1c of 6.0. Continue current regimen. Continue to monitor. Call with any concerns.

## 2022-05-24 NOTE — Assessment & Plan Note (Signed)
Cough no better on breztri. Will continue it and get him into pulmonology for evaluation when he returns from [place].  

## 2022-05-25 LAB — CBC WITH DIFFERENTIAL/PLATELET
Basophils Absolute: 0.1 10*3/uL (ref 0.0–0.2)
Basos: 1 %
EOS (ABSOLUTE): 0.2 10*3/uL (ref 0.0–0.4)
Eos: 2 %
Hematocrit: 51 % (ref 37.5–51.0)
Hemoglobin: 16.7 g/dL (ref 13.0–17.7)
Immature Grans (Abs): 0 10*3/uL (ref 0.0–0.1)
Immature Granulocytes: 0 %
Lymphocytes Absolute: 2.5 10*3/uL (ref 0.7–3.1)
Lymphs: 27 %
MCH: 30.7 pg (ref 26.6–33.0)
MCHC: 32.7 g/dL (ref 31.5–35.7)
MCV: 94 fL (ref 79–97)
Monocytes Absolute: 0.7 10*3/uL (ref 0.1–0.9)
Monocytes: 8 %
Neutrophils Absolute: 5.6 10*3/uL (ref 1.4–7.0)
Neutrophils: 62 %
Platelets: 175 10*3/uL (ref 150–450)
RBC: 5.44 x10E6/uL (ref 4.14–5.80)
RDW: 12.9 % (ref 11.6–15.4)
WBC: 9.2 10*3/uL (ref 3.4–10.8)

## 2022-05-25 LAB — LIPID PANEL W/O CHOL/HDL RATIO
Cholesterol, Total: 168 mg/dL (ref 100–199)
HDL: 42 mg/dL (ref >39)
LDL Chol Calc (NIH): 99 mg/dL (ref 0–99)
Triglycerides: 156 mg/dL — ABNORMAL HIGH (ref 0–149)
VLDL Cholesterol Cal: 27 mg/dL (ref 5–40)

## 2022-05-25 LAB — COMPREHENSIVE METABOLIC PANEL
ALT: 35 IU/L (ref 0–44)
AST: 22 IU/L (ref 0–40)
Albumin/Globulin Ratio: 2 (ref 1.2–2.2)
Albumin: 4.5 g/dL (ref 3.8–4.8)
Alkaline Phosphatase: 73 IU/L (ref 44–121)
BUN/Creatinine Ratio: 17 (ref 10–24)
BUN: 19 mg/dL (ref 8–27)
Bilirubin Total: 0.5 mg/dL (ref 0.0–1.2)
CO2: 22 mmol/L (ref 20–29)
Calcium: 9.4 mg/dL (ref 8.6–10.2)
Chloride: 103 mmol/L (ref 96–106)
Creatinine, Ser: 1.12 mg/dL (ref 0.76–1.27)
Globulin, Total: 2.2 g/dL (ref 1.5–4.5)
Glucose: 97 mg/dL (ref 70–99)
Potassium: 4.5 mmol/L (ref 3.5–5.2)
Sodium: 139 mmol/L (ref 134–144)
Total Protein: 6.7 g/dL (ref 6.0–8.5)
eGFR: 68 mL/min/{1.73_m2} (ref >59)

## 2022-05-25 LAB — TSH: TSH: 3.3 u[IU]/mL (ref 0.450–4.500)

## 2022-10-16 ENCOUNTER — Other Ambulatory Visit: Payer: Self-pay | Admitting: Family Medicine

## 2022-10-16 MED ORDER — LOSARTAN POTASSIUM 25 MG PO TABS: 25.0000 mg | ORAL_TABLET | Freq: Every day | ORAL | 0 refills | Status: DC

## 2022-10-16 NOTE — Telephone Encounter (Signed)
Medication Refill - Medication: losartan (COZAAR) 25 MG tablet   Has the patient contacted their pharmacy? Yes.   (Agent: If no, request that the patient contact the pharmacy for the refill. If patient does not wish to contact the pharmacy document the reason why and proceed with request.) (Agent: If yes, when and what did the pharmacy advise?)  Preferred Pharmacy (with phone number or street name):  [address], [address]  [place] [address]  Phone: [phone] Fax: [phone]   Has the patient been seen for an appointment in the last year OR does the patient have an upcoming appointment? Yes.    Agent: Please be advised that RX refills may take up to 3 business days. We ask that you follow-up with your pharmacy.

## 2022-10-16 NOTE — Telephone Encounter (Signed)
Requested Prescriptions  Pending Prescriptions Disp Refills   losartan (COZAAR) 25 MG tablet 90 tablet 0    Sig: Take 1 tablet (25 mg total) by mouth daily.     Cardiovascular:  Angiotensin Receptor Blockers Passed - 10/16/2022 11:13 AM      Passed - Cr in normal range and within 180 days    Creatinine, Ser  Date Value Ref Range Status  05/24/2022 1.12 0.76 - 1.27 mg/dL Final         Passed - K in normal range and within 180 days    Potassium  Date Value Ref Range Status  05/24/2022 4.5 3.5 - 5.2 mmol/L Final         Passed - Patient is not pregnant      Passed - Last BP in normal range    BP Readings from Last 1 Encounters:  05/24/22 132/82         Passed - Valid encounter within last 6 months    Recent Outpatient Visits           4 months ago Routine general medical examination at a health care facility   [facility], [facility] P, DO   5 months ago Simple chronic bronchitis [facility])   [place], Megan P, DO   6 months ago Chronic cough   [facility], [place] T, NP   8 months ago Osteoarthritis of spine with radiculopathy, cervical region   Rinard, Leroy, DO   9 months ago Primary hypertension   Offerle, [place], DO       Future Appointments             In 3 months Wynetta Emery, Barb Merino, DO [place]

## 2023-01-22 ENCOUNTER — Encounter: Payer: Self-pay | Admitting: Family Medicine

## 2023-01-22 ENCOUNTER — Ambulatory Visit (INDEPENDENT_AMBULATORY_CARE_PROVIDER_SITE_OTHER): Payer: Medicare PPO | Admitting: Family Medicine

## 2023-01-22 VITALS — BP 147/76 | HR 57 | Temp 98.3°F | Ht 68.5 in | Wt 205.0 lb

## 2023-01-22 DIAGNOSIS — R7301 Impaired fasting glucose: Secondary | ICD-10-CM

## 2023-01-22 DIAGNOSIS — I10 Essential (primary) hypertension: Secondary | ICD-10-CM | POA: Diagnosis not present

## 2023-01-22 DIAGNOSIS — M25511 Pain in right shoulder: Secondary | ICD-10-CM | POA: Diagnosis not present

## 2023-01-22 DIAGNOSIS — J410 Simple chronic bronchitis: Secondary | ICD-10-CM

## 2023-01-22 DIAGNOSIS — E782 Mixed hyperlipidemia: Secondary | ICD-10-CM

## 2023-01-22 DIAGNOSIS — Z8619 Personal history of other infectious and parasitic diseases: Secondary | ICD-10-CM

## 2023-01-22 DIAGNOSIS — G8929 Other chronic pain: Secondary | ICD-10-CM

## 2023-01-22 LAB — URINALYSIS, ROUTINE W REFLEX MICROSCOPIC
Bilirubin, UA: NEGATIVE
Glucose, UA: NEGATIVE
Ketones, UA: NEGATIVE
Leukocytes,UA: NEGATIVE
Nitrite, UA: NEGATIVE
Protein,UA: NEGATIVE
RBC, UA: NEGATIVE
Specific Gravity, UA: <1.005 — ABNORMAL LOW (ref 1.005–1.030)
Urobilinogen, Ur: 0.2 mg/dL (ref 0.2–1.0)
pH, UA: 5 (ref 5.0–7.5)

## 2023-01-22 LAB — BAYER DCA HB A1C WAIVED: HB A1C (BAYER DCA - WAIVED): 5.9 % — ABNORMAL HIGH (ref 4.8–5.6)

## 2023-01-22 LAB — MICROALBUMIN, URINE WAIVED
Creatinine, Urine Waived: 50 mg/dL (ref 10–300)
Microalb, Ur Waived: 10 mg/L (ref 0–19)
Microalb/Creat Ratio: <30 mg/g (ref <30)

## 2023-01-22 MED ORDER — ATORVASTATIN CALCIUM 40 MG PO TABS: 40.0000 mg | ORAL_TABLET | Freq: Every day | ORAL | 1 refills | Status: DC

## 2023-01-22 MED ORDER — LEVOCETIRIZINE DIHYDROCHLORIDE 5 MG PO TABS: 5.0000 mg | ORAL_TABLET | Freq: Every evening | ORAL | 1 refills | Status: DC

## 2023-01-22 MED ORDER — LOSARTAN POTASSIUM 25 MG PO TABS: 25.0000 mg | ORAL_TABLET | Freq: Every day | ORAL | 1 refills | Status: DC

## 2023-01-22 NOTE — Assessment & Plan Note (Signed)
Labs drawn today. Await results. Treat as needed.  

## 2023-01-22 NOTE — Assessment & Plan Note (Addendum)
Running well at home but a little high here. Will continue current regimen and work on diet at home. Recheck in 3 months. Call with any concerns.

## 2023-01-22 NOTE — Progress Notes (Signed)
BP (!) 147/76 (BP Location: Left Arm, Cuff Size: Normal)   Pulse (!) 57   Temp 98.3 F (36.8 C) (Oral)   Ht 5' 8.5" (1.74 m)   Wt 205 lb (93 kg)   SpO2 92%   BMI 30.72 kg/m    Subjective:    Patient ID: Connor Holmes, male    DOB: [birth year], 77 y.o.   MRN: [id number]  HPI: Connor Holmes is a 77 y.o. male  Chief Complaint  Patient presents with   Hyperlipidemia   Hypertension   Shoulder Injury    Patient says he has been having some consistent pain in his R shoulder and Bicep. Patient says he has had some injury to the area. Patient says the R shoulder he has a torn rotator cuff. Patient declines taking any medication for it.    SHOULDER PAIN Duration: 2 months Involved shoulder: R shoulder Mechanism of injury: fall Location: diffuse Onset:sudden Severity: moderate  Quality:  aching Frequency: with movement Radiation: into his bicep- hurting all the time for a couple of weeks ago Aggravating factors: movement  Alleviating factors: nothing  Status: worse Treatments attempted: rest and aleve  Relief with NSAIDs?:  no Weakness: no Numbness: no Decreased grip strength: no Redness: no Swelling: no Bruising: no Fevers: no  HYPERTENSION / HYPERLIPIDEMIA Satisfied with current treatment? yes Duration of hypertension: chronic BP monitoring frequency: daily BP range: 120s/70s BP medication side effects: no Past BP meds: losartan Duration of hyperlipidemia: chronic Cholesterol medication side effects: no Cholesterol supplements: none Past cholesterol medications: atorvastatin Medication compliance: fair compliance Aspirin: no Recent stressors: no Recurrent headaches: no Visual changes: no Palpitations: no Dyspnea: no Chest pain: no Lower extremity edema: no Dizzy/lightheaded: no   Relevant past medical, surgical, family and social history reviewed and updated as indicated. Interim medical history since our last visit reviewed. Allergies and medications  reviewed and updated.  Review of Systems  Constitutional: Negative.   Respiratory: Negative.    Cardiovascular: Negative.   Gastrointestinal: Negative.   Musculoskeletal:  Positive for arthralgias. Negative for back pain, gait problem, joint swelling, myalgias, neck pain and neck stiffness.  Neurological: Negative.   Psychiatric/Behavioral: Negative.      Per HPI unless specifically indicated above     Objective:    BP (!) 147/76 (BP Location: Left Arm, Cuff Size: Normal)   Pulse (!) 57   Temp 98.3 F (36.8 C) (Oral)   Ht 5' 8.5" (1.74 m)   Wt 205 lb (93 kg)   SpO2 92%   BMI 30.72 kg/m   Wt Readings from Last 3 Encounters:  01/22/23 205 lb (93 kg)  05/24/22 204 lb 11.2 oz (92.9 kg)  04/22/22 198 lb 11.2 oz (90.1 kg)    Physical Exam Vitals and nursing note reviewed.  Constitutional:      General: He is not in acute distress.    Appearance: Normal appearance. He is normal weight. He is not ill-appearing, toxic-appearing or diaphoretic.  HENT:     Head: Normocephalic and atraumatic.     Right Ear: External ear normal.     Left Ear: External ear normal.     Nose: Nose normal.     Mouth/Throat:     Mouth: Mucous membranes are moist.     Pharynx: Oropharynx is clear.  Eyes:     General: No scleral icterus.       Right eye: No discharge.        Left eye: No discharge.  Extraocular Movements: Extraocular movements intact.     Conjunctiva/sclera: Conjunctivae normal.     Pupils: Pupils are equal, round, and reactive to light.  Cardiovascular:     Rate and Rhythm: Normal rate and regular rhythm.     Pulses: Normal pulses.     Heart sounds: Normal heart sounds. No murmur heard.    No friction rub. No gallop.  Pulmonary:     Effort: Pulmonary effort is normal. No respiratory distress.     Breath sounds: Normal breath sounds. No stridor. No wheezing, rhonchi or rales.  Chest:     Chest wall: No tenderness.  Musculoskeletal:        General: Tenderness present. No  swelling, deformity or signs of injury.     Cervical back: Normal range of motion and neck supple.     Right lower leg: No edema.     Left lower leg: No edema.  Skin:    General: Skin is warm and dry.     Capillary Refill: Capillary refill takes less than 2 seconds.     Coloration: Skin is not jaundiced or pale.     Findings: No bruising, erythema, lesion or rash.  Neurological:     General: No focal deficit present.     Mental Status: He is alert and oriented to person, place, and time. Mental status is at baseline.  Psychiatric:        Mood and Affect: Mood normal.        Behavior: Behavior normal.        Thought Content: Thought content normal.        Judgment: Judgment normal.     Results for orders placed or performed in visit on 01/22/23  Microalbumin, Urine Waived  Result Value Ref Range   Microalb, Ur Waived 10 0 - 19 mg/L   Creatinine, Urine Waived 50 10 - 300 mg/dL   Microalb/Creat Ratio <30 <30 mg/g  Urinalysis, Routine w reflex microscopic  Result Value Ref Range   Specific Gravity, UA <1.005 (L) 1.005 - 1.030   pH, UA 5.0 5.0 - 7.5   Color, UA Yellow Yellow   Appearance Ur Clear Clear   Leukocytes,UA Negative Negative   Protein,UA Negative Negative/Trace   Glucose, UA Negative Negative   Ketones, UA Negative Negative   RBC, UA Negative Negative   Bilirubin, UA Negative Negative   Urobilinogen, Ur 0.2 0.2 - 1.0 mg/dL   Nitrite, UA Negative Negative   Microscopic Examination Comment   Bayer DCA Hb A1c Waived  Result Value Ref Range   HB A1C (BAYER DCA - WAIVED) 5.9 (H) 4.8 - 5.6 %      Assessment & Plan:   Problem List Items Addressed This Visit       Cardiovascular and Mediastinum   Primary hypertension    Running well at home but a little high here. Will continue current regimen and work on diet at home. Recheck in 3 months. Call with any concerns.       Relevant Medications   atorvastatin (LIPITOR) 40 MG tablet   losartan (COZAAR) 25 MG tablet    Other Relevant Orders   Microalbumin, Urine Waived (Completed)   Comprehensive metabolic panel   CBC with Differential/Platelet     Respiratory   Simple chronic bronchitis (HCC)    Seeing pulmonology on Monday. Encouraged him to use his breztri. Call with any concerns.       Relevant Orders   Comprehensive metabolic panel   CBC with  Differential/Platelet   TSH     Endocrine   IFG (impaired fasting glucose)    Rechecking labs today. Await results. Treat as needed.       Relevant Orders   Comprehensive metabolic panel   CBC with Differential/Platelet   Urinalysis, Routine w reflex microscopic (Completed)   Bayer DCA Hb A1c Waived (Completed)     Other   Mixed hyperlipidemia    Stopped his medicine. Restart medicine. Recheck at follow up.       Relevant Medications   atorvastatin (LIPITOR) 40 MG tablet   losartan (COZAAR) 25 MG tablet   Other Relevant Orders   Comprehensive metabolic panel   CBC with Differential/Platelet   Lipid Panel w/o Chol/HDL Ratio   History of Lyme disease    Labs drawn today. Await results. Treat as needed.       Relevant Orders   Lyme Disease Serology w/Reflex   Other Visit Diagnoses     Chronic right shoulder pain    -  Primary   Had a rotator cuff tear last year followed by a fall- much worse. Will get him into ortho. Referral generated today. Call with any concerns.   Relevant Orders   Ambulatory referral to Orthopedic Surgery        Follow up plan: Return in about 3 months (around 04/24/2023).

## 2023-01-22 NOTE — Assessment & Plan Note (Signed)
Rechecking labs today. Await results. Treat as needed.  °

## 2023-01-22 NOTE — Assessment & Plan Note (Signed)
Stopped his medicine. Restart medicine. Recheck at follow up.

## 2023-01-22 NOTE — Assessment & Plan Note (Signed)
Seeing pulmonology on Monday. Encouraged him to use his breztri. Call with any concerns.

## 2023-01-23 LAB — COMPREHENSIVE METABOLIC PANEL
ALT: 32 IU/L (ref 0–44)
AST: 21 IU/L (ref 0–40)
Albumin/Globulin Ratio: 1.7 (ref 1.2–2.2)
Albumin: 4.5 g/dL (ref 3.8–4.8)
Alkaline Phosphatase: 69 IU/L (ref 44–121)
BUN/Creatinine Ratio: 16 (ref 10–24)
BUN: 20 mg/dL (ref 8–27)
Bilirubin Total: 0.3 mg/dL (ref 0.0–1.2)
CO2: 21 mmol/L (ref 20–29)
Calcium: 9.7 mg/dL (ref 8.6–10.2)
Chloride: 102 mmol/L (ref 96–106)
Creatinine, Ser: 1.26 mg/dL (ref 0.76–1.27)
Globulin, Total: 2.6 g/dL (ref 1.5–4.5)
Glucose: 108 mg/dL — ABNORMAL HIGH (ref 70–99)
Potassium: 5 mmol/L (ref 3.5–5.2)
Sodium: 138 mmol/L (ref 134–144)
Total Protein: 7.1 g/dL (ref 6.0–8.5)
eGFR: 59 mL/min/{1.73_m2} — ABNORMAL LOW (ref >59)

## 2023-01-23 LAB — CBC WITH DIFFERENTIAL/PLATELET
Basophils Absolute: 0.1 10*3/uL (ref 0.0–0.2)
Basos: 1 %
EOS (ABSOLUTE): 0.2 10*3/uL (ref 0.0–0.4)
Eos: 2 %
Hematocrit: 54.4 % — ABNORMAL HIGH (ref 37.5–51.0)
Hemoglobin: 18.5 g/dL — ABNORMAL HIGH (ref 13.0–17.7)
Immature Grans (Abs): 0 10*3/uL (ref 0.0–0.1)
Immature Granulocytes: 0 %
Lymphocytes Absolute: 1.9 10*3/uL (ref 0.7–3.1)
Lymphs: 22 %
MCH: 31.8 pg (ref 26.6–33.0)
MCHC: 34 g/dL (ref 31.5–35.7)
MCV: 94 fL (ref 79–97)
Monocytes Absolute: 0.9 10*3/uL (ref 0.1–0.9)
Monocytes: 10 %
Neutrophils Absolute: 5.7 10*3/uL (ref 1.4–7.0)
Neutrophils: 65 %
Platelets: 188 10*3/uL (ref 150–450)
RBC: 5.81 x10E6/uL — ABNORMAL HIGH (ref 4.14–5.80)
RDW: 12.6 % (ref 11.6–15.4)
WBC: 8.8 10*3/uL (ref 3.4–10.8)

## 2023-01-23 LAB — LIPID PANEL W/O CHOL/HDL RATIO
Cholesterol, Total: 221 mg/dL — ABNORMAL HIGH (ref 100–199)
HDL: 35 mg/dL — ABNORMAL LOW (ref >39)
LDL Chol Calc (NIH): 118 mg/dL — ABNORMAL HIGH (ref 0–99)
Triglycerides: 387 mg/dL — ABNORMAL HIGH (ref 0–149)
VLDL Cholesterol Cal: 68 mg/dL — ABNORMAL HIGH (ref 5–40)

## 2023-01-23 LAB — TSH: TSH: 3.92 u[IU]/mL (ref 0.450–4.500)

## 2023-01-23 LAB — LYME DISEASE SEROLOGY W/REFLEX: Lyme Total Antibody EIA: NEGATIVE

## 2023-01-27 ENCOUNTER — Ambulatory Visit (INDEPENDENT_AMBULATORY_CARE_PROVIDER_SITE_OTHER): Payer: Medicare PPO | Admitting: Student in an Organized Health Care Education/Training Program

## 2023-01-27 ENCOUNTER — Encounter: Payer: Self-pay | Admitting: Student in an Organized Health Care Education/Training Program

## 2023-01-27 VITALS — BP 128/78 | HR 74 | Temp 97.7°F | Ht 68.0 in | Wt 200.4 lb

## 2023-01-27 DIAGNOSIS — F17200 Nicotine dependence, unspecified, uncomplicated: Secondary | ICD-10-CM | POA: Diagnosis not present

## 2023-01-27 DIAGNOSIS — J410 Simple chronic bronchitis: Secondary | ICD-10-CM | POA: Diagnosis not present

## 2023-01-27 DIAGNOSIS — R053 Chronic cough: Secondary | ICD-10-CM

## 2023-01-27 NOTE — Progress Notes (Signed)
Synopsis: Referred in for chronic cough by Dorcas Carrow, DO  Assessment & Plan:   #Simple chronic bronchitis #Chronic Cough #Tobacco Use Disorder  Patient describes symptoms consistent with chronic bronchitis, especially given his long standing smoking history. Will obtain pulmonary function testing to confirm the diagnosis of COPD, and I have asked the patient to use his Breztri as prescribed twice daily.  Furthermore, given the chronic cough and sputum production, the differential could be expanded to asbestosis, interstitial lung disease, and bronchiectasis. For workup, I will obtain a CT scan of the chest.  I also counseled him on the importance of smoking cessation.    - Pulmonary Function Test [facility] Only; Future - CT CHEST WO CONTRAST; Future - Smoking Cessation Encouraged   Return in about 4 weeks (around 02/24/2023).  I spent 60 minutes caring for this patient today, including preparing to see the patient, obtaining a medical history , reviewing a separately obtained history, performing a medically appropriate examination and/or evaluation, counseling and educating the patient/family/caregiver, ordering medications, tests, or procedures, and documenting clinical information in the electronic health record. 4 minutes spent on smoking cessation counseling.  Raechel Chute, MD [facility] 01/27/2023 2:14 PM    End of visit medications:  No orders of the defined types were placed in this encounter.    Current Outpatient Medications:    atorvastatin (LIPITOR) 40 MG tablet, Take 1 tablet (40 mg total) by mouth daily., Disp: 90 tablet, Rfl: 1   Budeson-Glycopyrrol-Formoterol (BREZTRI AEROSPHERE) 160-9-4.8 MCG/ACT AERO, Inhale 2 puffs into the lungs 2 (two) times daily., Disp: 10.7 g, Rfl: 11   cetirizine (ZYRTEC) 10 MG tablet, Take 1 tablet (10 mg total) by mouth daily., Disp: 90 tablet, Rfl: 4   fluticasone (FLONASE) 50 MCG/ACT nasal spray, Place into  both nostrils daily., Disp: , Rfl:    hydrochlorothiazide (HYDRODIURIL) 25 MG tablet, Take 1 tablet (25 mg total) by mouth daily as needed (swelling)., Disp: 90 tablet, Rfl: 1   levocetirizine (XYZAL) 5 MG tablet, Take 1 tablet (5 mg total) by mouth every evening., Disp: 90 tablet, Rfl: 1   losartan (COZAAR) 25 MG tablet, Take 1 tablet (25 mg total) by mouth daily., Disp: 90 tablet, Rfl: 1   Subjective:   PATIENT ID: Connor Holmes GENDER: male DOB: 03/25/1946, MRN: [id number]  Chief Complaint  Patient presents with   pulmonary consult    Prod cough with thick white sputum and occ SOB with exertion.     HPI  Patient is a pleasant 77 year old male presenting to clinic for the evaluation of chronic cough.  Patient symptoms started around 3 or 4 years ago when he developed a chronic cough that is productive of whitish sputum.  The color of the sputum has not changed and he denies any yellow/green/brown sputum and also denies any hemoptysis.  He does not feel short of breath going up a flight of stairs but will feel somewhat dyspneic when he exerts himself a lot.  The cough is constant and is disruptive to his quality of life. Patient had spirometry at his primary care physician's office with findings consistent with obstruction. He was prescribed Trelegy but did not tolerate it so was switched to [place].  At the moment, he is not using the [facility] as prescribed secondary to metallic/medicinal taste in his mouth.  He denies any personal history of asthma or lung disease growing up, does not have any family history of lung disease, and denies any symptoms of autoimmune  disease.   Patient reports longstanding history of smoking, having started at the age of 65 and continues to smoke.  At this time, he smokes half a pack a day.  At his height, he smoked 3 packs a day for few years, and has also smoked 2 packs a day for a few years.  Mostly, he has been 1 pack a day smoker.   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  He reports seeing a general practitioner in [place] who performed breathing tests and he was told he has chronic bronchitis. He does travel to [place] where he spent 6 months (he owns a house there).  Ancillary information including prior medications, full medical/surgical/family/social histories, and PFTs (when available) are listed below and have been reviewed.   Review of Systems  Constitutional:  Negative for chills, fever, malaise/fatigue and weight loss.  Respiratory:  Positive for cough, sputum production, shortness of breath and wheezing. Negative for hemoptysis.   Cardiovascular:  Negative for chest pain.     Objective:   Vitals:   01/27/23 1339  BP: 128/78  Pulse: 74  Temp: 97.7 F (36.5 C)  TempSrc: Temporal  SpO2: 95%  Weight: 200 lb 6.4 oz (90.9 kg)  Height: 5\' 8"  (1.727 m)   95% on RA  BMI Readings from Last 3 Encounters:  01/27/23 30.47 kg/m  01/22/23 30.72 kg/m  05/24/22 30.67 kg/m   Wt Readings from Last 3 Encounters:  01/27/23 200 lb 6.4 oz (90.9 kg)  01/22/23 205 lb (93 kg)  05/24/22 204 lb 11.2 oz (92.9 kg)    Physical Exam Constitutional:      Appearance: Normal appearance. He is not ill-appearing.  Cardiovascular:     Rate and Rhythm: Normal rate and regular rhythm.     Pulses: Normal pulses.     Heart sounds: Normal heart sounds.  Pulmonary:     Effort: Pulmonary effort is normal.     Breath sounds: Normal breath sounds.  Abdominal:     Palpations: Abdomen is soft.  Musculoskeletal:     Right lower leg: No edema.     Left lower leg: No edema.  Neurological:     General: No focal deficit present.     Mental Status: He is alert and oriented to person, place, and time. Mental status is at baseline.       Ancillary Information    Past Medical  History:  Diagnosis Date   Arthritis    Hypertension      Family History  Problem Relation Age of Onset   Alzheimer's disease Mother    Stroke Father      Past Surgical History:  Procedure Laterality Date   ABDOMINAL AORTA STENT     AMPUTATION FINGER     SHOULDER SURGERY Left     Social History   Socioeconomic History   Marital status: Married    Spouse name: Ana (LuLu)   Number of children: Not on file   Years of education: Not on file   Highest education level: Not on file  Occupational History   Not on file  Tobacco Use   Smoking status: Every Day    Packs/day: 1.00    Years: 60.00    Additional pack years: 0.00    Total pack years: 60.00    Types: Cigarettes  Smokeless tobacco: Never   Tobacco comments:    0.5PPD 01/27/2023  Vaping Use   Vaping Use: Never used  Substance and Sexual Activity   Alcohol use: Yes    Alcohol/week: 1.0 standard drink of alcohol    Types: 1 Shots of liquor per week   Drug use: Never   Sexual activity: Not Currently  Other Topics Concern   Not on file  Social History Narrative   Not on file   Social Determinants of Health   Financial Resource Strain: Low Risk  (03/20/2022)   Overall Financial Resource Strain (CARDIA)    Difficulty of Paying Living Expenses: Not hard at all  Food Insecurity: No Food Insecurity (03/20/2022)   Hunger Vital Sign    Worried About Running Out of Food in the Last Year: Never true    Ran Out of Food in the Last Year: Never true  Transportation Needs: No Transportation Needs (03/20/2022)   PRAPARE - Administrator, Civil Service (Medical): No    Lack of Transportation (Non-Medical): No  Physical Activity: Inactive (03/20/2022)   Exercise Vital Sign    Days of Exercise per Week: 0 days    Minutes of Exercise per Session: 0 min  Stress: No Stress Concern Present (03/20/2022)   [employer] of Occupational Health - Occupational Stress Questionnaire    Feeling of Stress : Not at all   Social Connections: Socially Integrated (03/20/2022)   Social Connection and Isolation Panel [NHANES]    Frequency of Communication with Friends and Family: Twice a week    Frequency of Social Gatherings with Friends and Family: More than three times a week    Attends Religious Services: More than 4 times per year    Active Member of [employer] or Organizations: Yes    Attends Engineer, structural: More than 4 times per year    Marital Status: Married  Catering manager Violence: Not At Risk (03/20/2022)   Humiliation, Afraid, Rape, and Kick questionnaire    Fear of Current or Ex-Partner: No    Emotionally Abused: No    Physically Abused: No    Sexually Abused: No     Allergies  Allergen Reactions   Other Nausea And Vomiting    Patient states he is allergic to all opioids, he gets very sick.     CBC    Component Value Date/Time   WBC 8.8 01/22/2023 0908   RBC 5.81 (H) 01/22/2023 0908   RBC 5.42 (A) 01/09/2021 0000   HGB 18.5 (H) 01/22/2023 0908   HCT 54.4 (H) 01/22/2023 0908   PLT 188 01/22/2023 0908   MCV 94 01/22/2023 0908   MCH 31.8 01/22/2023 0908   MCHC 34.0 01/22/2023 0908   RDW 12.6 01/22/2023 0908   LYMPHSABS 1.9 01/22/2023 0908   EOSABS 0.2 01/22/2023 0908   BASOSABS 0.1 01/22/2023 0908    Pulmonary Functions Testing Results:     No data to display          Outpatient Medications Prior to Visit  Medication Sig Dispense Refill   atorvastatin (LIPITOR) 40 MG tablet Take 1 tablet (40 mg total) by mouth daily. 90 tablet 1   Budeson-Glycopyrrol-Formoterol (BREZTRI AEROSPHERE) 160-9-4.8 MCG/ACT AERO Inhale 2 puffs into the lungs 2 (two) times daily. 10.7 g 11   cetirizine (ZYRTEC) 10 MG tablet Take 1 tablet (10 mg total) by mouth daily. 90 tablet 4   fluticasone (FLONASE) 50 MCG/ACT nasal spray Place into both nostrils daily.  hydrochlorothiazide (HYDRODIURIL) 25 MG tablet Take 1 tablet (25 mg total) by mouth daily as needed (swelling). 90 tablet 1    levocetirizine (XYZAL) 5 MG tablet Take 1 tablet (5 mg total) by mouth every evening. 90 tablet 1   losartan (COZAAR) 25 MG tablet Take 1 tablet (25 mg total) by mouth daily. 90 tablet 1   No facility-administered medications prior to visit.

## 2023-01-30 ENCOUNTER — Other Ambulatory Visit: Payer: Self-pay | Admitting: Orthopedic Surgery

## 2023-01-30 ENCOUNTER — Ambulatory Visit: Payer: Medicare PPO | Attending: Student in an Organized Health Care Education/Training Program

## 2023-01-30 DIAGNOSIS — J449 Chronic obstructive pulmonary disease, unspecified: Secondary | ICD-10-CM | POA: Diagnosis not present

## 2023-01-30 DIAGNOSIS — R942 Abnormal results of pulmonary function studies: Secondary | ICD-10-CM | POA: Insufficient documentation

## 2023-01-30 DIAGNOSIS — F17210 Nicotine dependence, cigarettes, uncomplicated: Secondary | ICD-10-CM | POA: Diagnosis not present

## 2023-01-30 DIAGNOSIS — R059 Cough, unspecified: Secondary | ICD-10-CM | POA: Insufficient documentation

## 2023-01-30 DIAGNOSIS — J410 Simple chronic bronchitis: Secondary | ICD-10-CM | POA: Diagnosis not present

## 2023-01-30 DIAGNOSIS — G8929 Other chronic pain: Secondary | ICD-10-CM

## 2023-01-30 LAB — PULMONARY FUNCTION TEST ARMC ONLY
DL/VA % pred: 45 %
DL/VA: 1.82 ml/min/mmHg/L
DLCO unc % pred: 48 %
DLCO unc: 11.48 ml/min/mmHg
FEF 25-75 Post: 0.83 L/sec
FEF 25-75 Pre: 1.06 L/sec
FEF2575-%Change-Post: -21 %
FEF2575-%Pred-Post: 41 %
FEF2575-%Pred-Pre: 53 %
FEV1-%Change-Post: -13 %
FEV1-%Pred-Post: 56 %
FEV1-%Pred-Pre: 65 %
FEV1-Post: 1.59 L
FEV1-Pre: 1.84 L
FEV1FVC-%Change-Post: -9 %
FEV1FVC-%Pred-Pre: 67 %
FEV6-%Change-Post: -3 %
FEV6-%Pred-Post: 98 %
FEV6-%Pred-Pre: 101 %
FEV6-Post: 3.57 L
FEV6-Pre: 3.71 L
FEV6FVC-%Change-Post: 0 %
FEV6FVC-%Pred-Post: 105 %
FEV6FVC-%Pred-Pre: 105 %
FVC-%Change-Post: -4 %
FVC-%Pred-Post: 92 %
FVC-%Pred-Pre: 97 %
FVC-Post: 3.62 L
FVC-Pre: 3.79 L
Post FEV1/FVC ratio: 44 %
Post FEV6/FVC ratio: 99 %
Pre FEV1/FVC ratio: 49 %
Pre FEV6/FVC Ratio: 98 %
RV % pred: 98 %
RV: 2.47 L
TLC % pred: 87 %
TLC: 5.88 L

## 2023-01-30 MED ORDER — ALBUTEROL SULFATE (2.5 MG/3ML) 0.083% IN NEBU
2.5000 mg | INHALATION_SOLUTION | Freq: Once | RESPIRATORY_TRACT | Status: AC
  Administered 2023-01-30: 2.5 mg via RESPIRATORY_TRACT
  Filled 2023-01-30: qty 3

## 2023-02-11 ENCOUNTER — Ambulatory Visit
Admission: RE | Admit: 2023-02-11 | Discharge: 2023-02-11 | Disposition: A | Discharge status: Home / Self Care | Payer: Medicare PPO | Source: Ambulatory Visit | Attending: Family Medicine | Admitting: Family Medicine

## 2023-02-11 DIAGNOSIS — J439 Emphysema, unspecified: Secondary | ICD-10-CM | POA: Diagnosis not present

## 2023-02-11 DIAGNOSIS — Z77090 Contact with and (suspected) exposure to asbestos: Secondary | ICD-10-CM | POA: Insufficient documentation

## 2023-02-11 DIAGNOSIS — I700 Atherosclerosis of aorta: Secondary | ICD-10-CM | POA: Insufficient documentation

## 2023-02-11 DIAGNOSIS — J410 Simple chronic bronchitis: Secondary | ICD-10-CM | POA: Diagnosis present

## 2023-02-11 DIAGNOSIS — I2510 Atherosclerotic heart disease of native coronary artery without angina pectoris: Secondary | ICD-10-CM | POA: Diagnosis not present

## 2023-02-11 DIAGNOSIS — K760 Fatty (change of) liver, not elsewhere classified: Secondary | ICD-10-CM | POA: Insufficient documentation

## 2023-02-11 DIAGNOSIS — Z87891 Personal history of nicotine dependence: Secondary | ICD-10-CM | POA: Insufficient documentation

## 2023-02-21 ENCOUNTER — Encounter: Payer: Self-pay | Admitting: Student in an Organized Health Care Education/Training Program

## 2023-02-21 ENCOUNTER — Ambulatory Visit (INDEPENDENT_AMBULATORY_CARE_PROVIDER_SITE_OTHER): Payer: Medicare PPO | Admitting: Student in an Organized Health Care Education/Training Program

## 2023-02-21 VITALS — BP 130/74 | HR 67 | Temp 97.7°F | Ht 68.0 in | Wt 201.0 lb

## 2023-02-21 DIAGNOSIS — J410 Simple chronic bronchitis: Secondary | ICD-10-CM

## 2023-02-21 DIAGNOSIS — F17200 Nicotine dependence, unspecified, uncomplicated: Secondary | ICD-10-CM | POA: Diagnosis not present

## 2023-02-21 NOTE — Progress Notes (Signed)
Assessment & Plan:   #COPD GOLD 2A #Tobacco Use Disorder   Presenting for follow up today, with recent PFT's being consistent with the diagnosis of moderate COPD.  Chest CT shows emphysema. He's started on Breztri and feels much better with it and his cough is resolved. He continues to smoke 10 cigarettes a day and I've encouraged him to quit smoking. He would be open to discussing cessation in the future. Also discussed a cardiology referral (given coronary calcifications on the CT) and he would like to discuss with his PCP. Will continue Breztri and he will present for follow up after he comes back from [place]  -Continue Breztri two puffs twice daily  Return in about 1 year (around 02/21/2024).  I spent 20 minutes caring for this patient today, including preparing to see the patient, obtaining a medical history , reviewing a separately obtained history, performing a medically appropriate examination and/or evaluation, counseling and educating the patient/family/caregiver, documenting clinical information in the electronic health record, and independently interpreting results (not separately reported/billed) and communicating results to the patient/family/caregiver  Raechel Chute, MD [facility] 02/21/2023 9:31 AM    End of visit medications:  No orders of the defined types were placed in this encounter.    Current Outpatient Medications:    atorvastatin (LIPITOR) 40 MG tablet, Take 1 tablet (40 mg total) by mouth daily., Disp: 90 tablet, Rfl: 1   Budeson-Glycopyrrol-Formoterol (BREZTRI AEROSPHERE) 160-9-4.8 MCG/ACT AERO, Inhale 2 puffs into the lungs 2 (two) times daily., Disp: 10.7 g, Rfl: 11   cetirizine (ZYRTEC) 10 MG tablet, Take 1 tablet (10 mg total) by mouth daily., Disp: 90 tablet, Rfl: 4   fluticasone (FLONASE) 50 MCG/ACT nasal spray, Place into both nostrils daily., Disp: , Rfl:    hydrochlorothiazide (HYDRODIURIL) 25 MG tablet, Take 1 tablet (25  mg total) by mouth daily as needed (swelling)., Disp: 90 tablet, Rfl: 1   levocetirizine (XYZAL) 5 MG tablet, Take 1 tablet (5 mg total) by mouth every evening., Disp: 90 tablet, Rfl: 1   losartan (COZAAR) 25 MG tablet, Take 1 tablet (25 mg total) by mouth daily., Disp: 90 tablet, Rfl: 1   Subjective:   PATIENT ID: Connor Holmes GENDER: male DOB: 1945/09/25, MRN: [id number]  Chief Complaint  Patient presents with   Follow-up    Morning cough- prod with white sputum.     HPI  Patient is a pleasant 77 year old male presenting to clinic for follow up.  He feels very well on Breztri and his symptoms of cough have improved. Denies much in the way of shortness of breath. No chest pain, tightness, or wheeze reported. Patient overall feels much better compared to prior.   Initial visited noted that symptoms started 3 years ago and mostly consisted of a chronic cough productive of whitish sputum. He denied shortness of breath. Patient had spirometry at his primary care physician's office with findings consistent with obstruction. He was prescribed Trelegy but did not tolerate it so was switched to [place] which he did not use. He denied any personal history of asthma or lung disease growing up, does not have any family history of lung disease, and denies any symptoms of autoimmune disease.    Patient reports longstanding history of smoking, having started at the age of 23 and continues to smoke.  At this time, he smokes 10 hand rolled cigarettes a day.  At his height, he smoked 3 packs a day for few years, and has also  smoked 2 packs a day for a few years.  Mostly, he has been 1 pack a day smoker.    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  He reports seeing a general practitioner in [place] who performed breathing tests and he  was told he has chronic bronchitis. He does travel to [place] where he spent 6 months (he owns a house there).  Ancillary information including prior medications, full medical/surgical/family/social histories, and PFTs (when available) are listed below and have been reviewed.   Review of Systems  Constitutional:  Negative for chills, fever, malaise/fatigue and weight loss.  Respiratory:  Negative for cough, hemoptysis, sputum production, shortness of breath and wheezing.   Cardiovascular:  Negative for chest pain.     Objective:   Vitals:   02/21/23 0915  BP: 130/74  Pulse: 67  Temp: 97.7 F (36.5 C)  TempSrc: Temporal  SpO2: 93%  Weight: 201 lb (91.2 kg)  Height: 5\' 8"  (1.727 m)   93% on RA  BMI Readings from Last 3 Encounters:  02/21/23 30.56 kg/m  01/27/23 30.47 kg/m  01/22/23 30.72 kg/m   Wt Readings from Last 3 Encounters:  02/21/23 201 lb (91.2 kg)  01/27/23 200 lb 6.4 oz (90.9 kg)  01/22/23 205 lb (93 kg)    Physical Exam Constitutional:      Appearance: Normal appearance. He is not ill-appearing.  Cardiovascular:     Rate and Rhythm: Normal rate and regular rhythm.     Pulses: Normal pulses.     Heart sounds: Normal heart sounds.  Pulmonary:     Effort: Pulmonary effort is normal.     Breath sounds: Normal breath sounds.  Abdominal:     Palpations: Abdomen is soft.  Musculoskeletal:     Right lower leg: No edema.     Left lower leg: No edema.  Neurological:     General: No focal deficit present.     Mental Status: He is alert and oriented to person, place, and time. Mental status is at baseline.       Ancillary Information    Past Medical History:  Diagnosis Date   Arthritis    Hypertension      Family History  Problem Relation Age of Onset   Alzheimer's disease Mother    Stroke Father      Past Surgical History:  Procedure Laterality Date   ABDOMINAL AORTA STENT     AMPUTATION FINGER     SHOULDER SURGERY Left     Social  History   Socioeconomic History   Marital status: Married    Spouse name: Ana (LuLu)   Number of children: Not on file   Years of education: Not on file   Highest education level: Not on file  Occupational History   Not on file  Tobacco Use   Smoking status: Every Day    Packs/day: 1.00    Years: 60.00    Additional pack years: 0.00    Total pack years: 60.00    Types: Cigarettes   Smokeless tobacco: Never   Tobacco comments:    0.5PPD 02/21/2023  Vaping Use   Vaping Use: Never used  Substance and Sexual Activity   Alcohol use: Yes    Alcohol/week: 1.0 standard drink of alcohol    Types: 1 Shots of liquor per week  Drug use: Never   Sexual activity: Not Currently  Other Topics Concern   Not on file  Social History Narrative   Not on file   Social Determinants of Health   Financial Resource Strain: Low Risk  (03/20/2022)   Overall Financial Resource Strain (CARDIA)    Difficulty of Paying Living Expenses: Not hard at all  Food Insecurity: No Food Insecurity (03/20/2022)   Hunger Vital Sign    Worried About Running Out of Food in the Last Year: Never true    Ran Out of Food in the Last Year: Never true  Transportation Needs: No Transportation Needs (03/20/2022)   PRAPARE - Administrator, Civil Service (Medical): No    Lack of Transportation (Non-Medical): No  Physical Activity: Inactive (03/20/2022)   Exercise Vital Sign    Days of Exercise per Week: 0 days    Minutes of Exercise per Session: 0 min  Stress: No Stress Concern Present (03/20/2022)   [employer] of Occupational Health - Occupational Stress Questionnaire    Feeling of Stress : Not at all  Social Connections: Socially Integrated (03/20/2022)   Social Connection and Isolation Panel [NHANES]    Frequency of Communication with Friends and Family: Twice a week    Frequency of Social Gatherings with Friends and Family: More than three times a week    Attends Religious Services: More than 4  times per year    Active Member of [employer] or Organizations: Yes    Attends Engineer, structural: More than 4 times per year    Marital Status: Married  Catering manager Violence: Not At Risk (03/20/2022)   Humiliation, Afraid, Rape, and Kick questionnaire    Fear of Current or Ex-Partner: No    Emotionally Abused: No    Physically Abused: No    Sexually Abused: No     Allergies  Allergen Reactions   Other Nausea And Vomiting    Patient states he is allergic to all opioids, he gets very sick.     CBC    Component Value Date/Time   WBC 8.8 01/22/2023 0908   RBC 5.81 (H) 01/22/2023 0908   RBC 5.42 (A) 01/09/2021 0000   HGB 18.5 (H) 01/22/2023 0908   HCT 54.4 (H) 01/22/2023 0908   PLT 188 01/22/2023 0908   MCV 94 01/22/2023 0908   MCH 31.8 01/22/2023 0908   MCHC 34.0 01/22/2023 0908   RDW 12.6 01/22/2023 0908   LYMPHSABS 1.9 01/22/2023 0908   EOSABS 0.2 01/22/2023 0908   BASOSABS 0.1 01/22/2023 0908    Pulmonary Functions Testing Results:    Latest Ref Rng & Units 01/30/2023    3:27 PM  PFT Results  FVC-Pre L 3.79   FVC-Predicted Pre % 97   FVC-Post L 3.62   FVC-Predicted Post % 92   Pre FEV1/FVC % % 49   Post FEV1/FCV % % 44   FEV1-Pre L 1.84   FEV1-Predicted Pre % 65   FEV1-Post L 1.59   DLCO uncorrected ml/min/mmHg 11.48   DLCO UNC% % 48   DLVA Predicted % 45   TLC L 5.88   TLC % Predicted % 87   RV % Predicted % 98     Outpatient Medications Prior to Visit  Medication Sig Dispense Refill   atorvastatin (LIPITOR) 40 MG tablet Take 1 tablet (40 mg total) by mouth daily. 90 tablet 1   Budeson-Glycopyrrol-Formoterol (BREZTRI AEROSPHERE) 160-9-4.8 MCG/ACT AERO Inhale 2 puffs into the lungs 2 (two)  times daily. 10.7 g 11   cetirizine (ZYRTEC) 10 MG tablet Take 1 tablet (10 mg total) by mouth daily. 90 tablet 4   fluticasone (FLONASE) 50 MCG/ACT nasal spray Place into both nostrils daily.     hydrochlorothiazide (HYDRODIURIL) 25 MG tablet Take 1 tablet  (25 mg total) by mouth daily as needed (swelling). 90 tablet 1   levocetirizine (XYZAL) 5 MG tablet Take 1 tablet (5 mg total) by mouth every evening. 90 tablet 1   losartan (COZAAR) 25 MG tablet Take 1 tablet (25 mg total) by mouth daily. 90 tablet 1   No facility-administered medications prior to visit.

## 2023-03-04 ENCOUNTER — Telehealth: Payer: Self-pay | Admitting: Family Medicine

## 2023-03-04 NOTE — Telephone Encounter (Unsigned)
Copied from CRM [id number]. Topic: Referral - Request for Referral >> Mar 04, 2023  9:27 AM Dondra Prader A wrote: Has patient seen PCP for this complaint? Yes.   *If NO, is insurance requiring patient see PCP for this issue before PCP can refer them? Referral for which specialty: Cardiologist  Preferred provider/office: Pt would like for PCP to refer a good Cardiologist in [place]. Reason for referral: Pt states that he had a CT scan on his chest and 3 vessels are messed up and he has a aorta that needs to be checked every two years.

## 2023-03-11 ENCOUNTER — Encounter: Payer: Self-pay | Admitting: Family Medicine

## 2023-03-11 ENCOUNTER — Ambulatory Visit (INDEPENDENT_AMBULATORY_CARE_PROVIDER_SITE_OTHER): Payer: Medicare PPO | Admitting: Family Medicine

## 2023-03-11 ENCOUNTER — Telehealth: Payer: Self-pay

## 2023-03-11 VITALS — BP 146/85 | HR 66 | Temp 97.6°F | Wt 203.2 lb

## 2023-03-11 DIAGNOSIS — I2510 Atherosclerotic heart disease of native coronary artery without angina pectoris: Secondary | ICD-10-CM

## 2023-03-11 DIAGNOSIS — D751 Secondary polycythemia: Secondary | ICD-10-CM

## 2023-03-11 DIAGNOSIS — J439 Emphysema, unspecified: Secondary | ICD-10-CM

## 2023-03-11 DIAGNOSIS — E782 Mixed hyperlipidemia: Secondary | ICD-10-CM

## 2023-03-11 DIAGNOSIS — I700 Atherosclerosis of aorta: Secondary | ICD-10-CM

## 2023-03-11 DIAGNOSIS — K760 Fatty (change of) liver, not elsewhere classified: Secondary | ICD-10-CM | POA: Diagnosis not present

## 2023-03-11 NOTE — Assessment & Plan Note (Signed)
Found incidentally on CT. Will keep BP and cholesterol under good control. Call with any concerns.

## 2023-03-11 NOTE — Progress Notes (Signed)
BP (!) 146/85   Pulse 66   Temp 97.6 F (36.4 C) (Oral)   Wt 203 lb 3.2 oz (92.2 kg)   SpO2 92%   BMI 30.90 kg/m    Subjective:    Patient ID: Connor Holmes, male    DOB: 01-28-1946, 77 y.o.   MRN: [id number]  HPI: Connor Holmes is a 77 y.o. male  Chief Complaint  Patient presents with   Coronary Artery Disease   Hyperlipidemia   polycythemia   Went to see pulmonology and had CT of his chest which showed CAD. He would like to see cardiology. Doing well with his breathing. No CP. No other concerns. Notes that he had "thick blood" in the past and had to do phlebotomy. Has not done this in a couple of years and is feeling sluggish again.   HYPERLIPIDEMIA Hyperlipidemia status: restarted meds about 6 weeks ago Satisfied with current treatment?  yes Side effects:  no Medication compliance: good compliance Past cholesterol meds: atorvastatin Supplements: none Aspirin:  no The 10-year ASCVD risk score (Arnett DK, et al., 2019) is: 43.9%   Values used to calculate the score:     Age: 1 years     Sex: Male     Is Non-Hispanic African American: No     Diabetic: No     Tobacco smoker: Yes     Systolic Blood Pressure: 146 mmHg     Is BP treated: Yes     HDL Cholesterol: 35 mg/dL     Total Cholesterol: 221 mg/dL Chest pain:  no Coronary artery disease:  yes  Relevant past medical, surgical, family and social history reviewed and updated as indicated. Interim medical history since our last visit reviewed. Allergies and medications reviewed and updated.  Review of Systems  Constitutional: Negative.   Respiratory: Negative.    Cardiovascular: Negative.   Gastrointestinal: Negative.   Musculoskeletal: Negative.   Neurological: Negative.   Psychiatric/Behavioral: Negative.      Per HPI unless specifically indicated above     Objective:    BP (!) 146/85   Pulse 66   Temp 97.6 F (36.4 C) (Oral)   Wt 203 lb 3.2 oz (92.2 kg)   SpO2 92%   BMI 30.90 kg/m   Wt Readings  from Last 3 Encounters:  03/11/23 203 lb 3.2 oz (92.2 kg)  02/21/23 201 lb (91.2 kg)  01/27/23 200 lb 6.4 oz (90.9 kg)    Physical Exam Vitals and nursing note reviewed.  Constitutional:      General: He is not in acute distress.    Appearance: Normal appearance. He is not ill-appearing, toxic-appearing or diaphoretic.  HENT:     Head: Normocephalic and atraumatic.     Right Ear: External ear normal.     Left Ear: External ear normal.     Nose: Nose normal.     Mouth/Throat:     Mouth: Mucous membranes are moist.     Pharynx: Oropharynx is clear.  Eyes:     General: No scleral icterus.       Right eye: No discharge.        Left eye: No discharge.     Extraocular Movements: Extraocular movements intact.     Conjunctiva/sclera: Conjunctivae normal.     Pupils: Pupils are equal, round, and reactive to light.  Cardiovascular:     Rate and Rhythm: Normal rate and regular rhythm.     Pulses: Normal pulses.     Heart sounds: Normal heart  sounds. No murmur heard.    No friction rub. No gallop.  Pulmonary:     Effort: Pulmonary effort is normal. No respiratory distress.     Breath sounds: Normal breath sounds. No stridor. No wheezing, rhonchi or rales.  Chest:     Chest wall: No tenderness.  Musculoskeletal:        General: Normal range of motion.     Cervical back: Normal range of motion and neck supple.  Skin:    General: Skin is warm and dry.     Capillary Refill: Capillary refill takes less than 2 seconds.     Coloration: Skin is not jaundiced or pale.     Findings: No bruising, erythema, lesion or rash.  Neurological:     General: No focal deficit present.     Mental Status: He is alert and oriented to person, place, and time. Mental status is at baseline.  Psychiatric:        Mood and Affect: Mood normal.        Behavior: Behavior normal.        Thought Content: Thought content normal.        Judgment: Judgment normal.     Results for orders placed or performed in  visit on 01/30/23  Pulmonary Function Test [facility] Only  Result Value Ref Range   FVC-Pre 3.79 L   FVC-%Pred-Pre 97 %   FVC-Post 3.62 L   FVC-%Pred-Post 92 %   FVC-%Change-Post -4 %   FEV1-Pre 1.84 L   FEV1-%Pred-Pre 65 %   FEV1-Post 1.59 L   FEV1-%Pred-Post 56 %   FEV1-%Change-Post -13 %   FEV6-Pre 3.71 L   FEV6-%Pred-Pre 101 %   FEV6-Post 3.57 L   FEV6-%Pred-Post 98 %   FEV6-%Change-Post -3 %   Pre FEV1/FVC ratio 49 %   FEV1FVC-%Pred-Pre 67 %   Post FEV1/FVC ratio 44 %   FEV1FVC-%Change-Post -9 %   Pre FEV6/FVC Ratio 98 %   FEV6FVC-%Pred-Pre 105 %   Post FEV6/FVC ratio 99 %   FEV6FVC-%Pred-Post 105 %   FEV6FVC-%Change-Post 0 %   FEF 25-75 Pre 1.06 L/sec   FEF2575-%Pred-Pre 53 %   FEF 25-75 Post 0.83 L/sec   FEF2575-%Pred-Post 41 %   FEF2575-%Change-Post -21 %   RV 2.47 L   RV % pred 98 %   TLC 5.88 L   TLC % pred 87 %   DLCO unc 11.48 ml/min/mmHg   DLCO unc % pred 48 %   DL/VA 9.56 ml/min/mmHg/L   DL/VA % pred 45 %      Assessment & Plan:   Problem List Items Addressed This Visit       Cardiovascular and Mediastinum   Coronary artery disease involving native coronary artery of native heart without angina pectoris - Primary    Asymptomatic. Will get him into cardiology- referral generated. Will keep BP and cholesterol under good control. Call with any concerns.       Relevant Orders   Ambulatory referral to Cardiology   Aortic atherosclerosis (HCC)    Found incidentally on CT. Will keep BP and cholesterol under good control. Call with any concerns.         Respiratory   Pulmonary emphysema (HCC)    Stable. Continue to follow with pulmonology. Call with any concerns.         Digestive   Hepatic steatosis    Found incidentally on CT. Will work on diet and exercise. Call with any concerns.  Other   Mixed hyperlipidemia    Restarted his medicine. Will recheck labs today. Call with any concerns.       Relevant Orders   Comprehensive  metabolic panel   Lipid Panel w/o Chol/HDL Ratio   Polycythemia    Was previously doing phlebotomy. Will go back to donation. Recheck every 3 months.         Follow up plan: Return for As scheduled, please schedule with Lorrie any time after 03/21/23. Marland Kitchen

## 2023-03-11 NOTE — Assessment & Plan Note (Signed)
Restarted his medicine. Will recheck labs today. Call with any concerns.

## 2023-03-11 NOTE — Assessment & Plan Note (Signed)
Was previously doing phlebotomy. Will go back to donation. Recheck every 3 months.

## 2023-03-11 NOTE — Patient Instructions (Signed)
Patient has an scheduled appointment at [facility] on Monday July 22nd at 3:00 PM.

## 2023-03-11 NOTE — Telephone Encounter (Signed)
Left message for patient to make patient aware of his appointment with [facility] on Monday July 22nd at 3:00 PM. Advised patient to give our office a call back if she has any questions or concerns.

## 2023-03-11 NOTE — Assessment & Plan Note (Signed)
 Stable. Continue to follow with pulmonology. Call with any concerns.  

## 2023-03-11 NOTE — Assessment & Plan Note (Signed)
Found incidentally on CT. Will work on diet and exercise. Call with any concerns.

## 2023-03-11 NOTE — Assessment & Plan Note (Signed)
Asymptomatic. Will get him into cardiology- referral generated. Will keep BP and cholesterol under good control. Call with any concerns.

## 2023-03-12 ENCOUNTER — Encounter: Payer: Self-pay | Admitting: Family Medicine

## 2023-03-12 LAB — LIPID PANEL W/O CHOL/HDL RATIO
Cholesterol, Total: 152 mg/dL (ref 100–199)
HDL: 43 mg/dL (ref >39)
LDL Chol Calc (NIH): 83 mg/dL (ref 0–99)
Triglycerides: 151 mg/dL — ABNORMAL HIGH (ref 0–149)
VLDL Cholesterol Cal: 26 mg/dL (ref 5–40)

## 2023-03-12 LAB — COMPREHENSIVE METABOLIC PANEL
ALT: 27 IU/L (ref 0–44)
AST: 19 IU/L (ref 0–40)
Albumin: 4.6 g/dL (ref 3.8–4.8)
Alkaline Phosphatase: 75 IU/L (ref 44–121)
BUN/Creatinine Ratio: 15 (ref 10–24)
BUN: 19 mg/dL (ref 8–27)
Bilirubin Total: 0.8 mg/dL (ref 0.0–1.2)
CO2: 21 mmol/L (ref 20–29)
Calcium: 9.5 mg/dL (ref 8.6–10.2)
Chloride: 104 mmol/L (ref 96–106)
Creatinine, Ser: 1.27 mg/dL (ref 0.76–1.27)
Globulin, Total: 2.6 g/dL (ref 1.5–4.5)
Glucose: 87 mg/dL (ref 70–99)
Potassium: 4.7 mmol/L (ref 3.5–5.2)
Sodium: 141 mmol/L (ref 134–144)
Total Protein: 7.2 g/dL (ref 6.0–8.5)
eGFR: 58 mL/min/{1.73_m2} — ABNORMAL LOW (ref >59)

## 2023-03-25 ENCOUNTER — Ambulatory Visit: Payer: Medicare PPO | Admitting: Cardiology

## 2023-03-25 ENCOUNTER — Encounter: Payer: Self-pay | Admitting: Family Medicine

## 2023-03-25 ENCOUNTER — Ambulatory Visit (INDEPENDENT_AMBULATORY_CARE_PROVIDER_SITE_OTHER): Payer: Medicare PPO

## 2023-03-25 VITALS — BP 138/80 | Ht 69.0 in | Wt 202.2 lb

## 2023-03-25 DIAGNOSIS — Z0000 Encounter for general adult medical examination without abnormal findings: Secondary | ICD-10-CM

## 2023-03-25 NOTE — Progress Notes (Signed)
Subjective:   Connor Holmes is a 77 y.o. male who presents for Medicare Annual/Subsequent preventive examination.  Visit Complete: In person  Review of Systems     Cardiac Risk Factors include: advanced age (>49men, >37 women);obesity (BMI >30kg/m2);hypertension;male gender;smoking/ tobacco exposure     Objective:    Today's Vitals   03/25/23 1330 03/25/23 1332  BP: 138/80   Weight: 202 lb 3.2 oz (91.7 kg)   Height: 5\' 9"  (1.753 m)   PainSc:  6    Body mass index is 29.86 kg/m.     03/25/2023    1:40 PM  Advanced Directives  Does Patient Have a Medical Advance Directive? No  Would patient like information on creating a medical advance directive? No - Patient declined    Current Medications (verified) Outpatient Encounter Medications as of 03/25/2023  Medication Sig   atorvastatin (LIPITOR) 40 MG tablet Take 1 tablet (40 mg total) by mouth daily.   Budeson-Glycopyrrol-Formoterol (BREZTRI AEROSPHERE) 160-9-4.8 MCG/ACT AERO Inhale 2 puffs into the lungs 2 (two) times daily.   fluticasone (FLONASE) 50 MCG/ACT nasal spray Place into both nostrils daily.   levocetirizine (XYZAL) 5 MG tablet Take 1 tablet (5 mg total) by mouth every evening.   losartan (COZAAR) 25 MG tablet Take 1 tablet (25 mg total) by mouth daily.   cetirizine (ZYRTEC) 10 MG tablet Take 1 tablet (10 mg total) by mouth daily. (Patient not taking: Reported on 03/25/2023)   hydrochlorothiazide (HYDRODIURIL) 25 MG tablet Take 1 tablet (25 mg total) by mouth daily as needed (swelling). (Patient not taking: Reported on 03/25/2023)   No facility-administered encounter medications on file as of 03/25/2023.    Allergies (verified) Other   History: Past Medical History:  Diagnosis Date   Arthritis    Hypertension    Past Surgical History:  Procedure Laterality Date   ABDOMINAL AORTA STENT     AMPUTATION FINGER     SHOULDER SURGERY Left    Family History  Problem Relation Age of Onset   Alzheimer's disease  Mother    Stroke Father    Social History   Socioeconomic History   Marital status: Married    Spouse name: Ana (LuLu)   Number of children: Not on file   Years of education: Not on file   Highest education level: Not on file  Occupational History   Not on file  Tobacco Use   Smoking status: Every Day    Current packs/day: 1.00    Average packs/day: 1 pack/day for 60.0 years (60.0 ttl pk-yrs)    Types: Cigarettes   Smokeless tobacco: Never   Tobacco comments:    0.5PPD 02/21/2023  Vaping Use   Vaping status: Never Used  Substance and Sexual Activity   Alcohol use: Yes    Alcohol/week: 1.0 standard drink of alcohol    Types: 1 Shots of liquor per week   Drug use: Never   Sexual activity: Not Currently  Other Topics Concern   Not on file  Social History Narrative   Not on file   Social Determinants of Health   Financial Resource Strain: Low Risk  (03/25/2023)   Overall Financial Resource Strain (CARDIA)    Difficulty of Paying Living Expenses: Not hard at all  Food Insecurity: No Food Insecurity (03/25/2023)   Hunger Vital Sign    Worried About Running Out of Food in the Last Year: Never true    Ran Out of Food in the Last Year: Never true  Transportation Needs:  No Transportation Needs (03/25/2023)   PRAPARE - Administrator, Civil Service (Medical): No    Lack of Transportation (Non-Medical): No  Physical Activity: Sufficiently Active (03/25/2023)   Exercise Vital Sign    Days of Exercise per Week: 3 days    Minutes of Exercise per Session: 60 min  Stress: No Stress Concern Present (03/25/2023)   [employer] of Occupational Health - Occupational Stress Questionnaire    Feeling of Stress : Not at all  Social Connections: Moderately Integrated (03/25/2023)   Social Connection and Isolation Panel [NHANES]    Frequency of Communication with Friends and Family: More than three times a week    Frequency of Social Gatherings with Friends and Family: Once a  week    Attends Religious Services: More than 4 times per year    Active Member of [employer] or Organizations: No    Attends Banker Meetings: Never    Marital Status: Married    Tobacco Counseling Ready to quit: Not Answered Counseling given: Not Answered Tobacco comments: 0.5PPD 02/21/2023   Clinical Intake:  Pre-visit preparation completed: Yes  Pain : 0-10 Pain Score: 6  Pain Type: Chronic pain Pain Location: Shoulder Pain Orientation: Right Pain Radiating Towards: rotator cuff injury     Nutritional Risks: None Diabetes: No  How often do you need to have someone help you when you read instructions, pamphlets, or other written materials from your doctor or pharmacy?: 1 - Never  Interpreter Needed?: No  Information entered by :: Kennedy Bucker, LPN   Activities of Daily Living    03/25/2023    1:41 PM  In your present state of health, do you have any difficulty performing the following activities:  Hearing? 0  Vision? 0  Difficulty concentrating or making decisions? 0  Walking or climbing stairs? 0  Dressing or bathing? 0  Doing errands, shopping? 0  Preparing Food and eating ? N  Using the Toilet? N  In the past six months, have you accidently leaked urine? N  Do you have problems with loss of bowel control? N  Managing your Medications? N  Managing your Finances? N  Housekeeping or managing your Housekeeping? N    Patient Care Team: Dorcas Carrow, DO as PCP - General (Family Medicine)  Indicate any recent Medical Services you may have received from other than [facility] providers in the past year (date may be approximate).     Assessment:   This is a routine wellness examination for Connor Holmes.  Hearing/Vision screen Hearing Screening - Comments:: No aids Vision Screening - Comments:: Wears glasses  Dietary issues and exercise activities discussed:     Goals Addressed             This Visit's Progress    DIET - EAT MORE FRUITS AND  VEGETABLES         Depression Screen    03/25/2023    1:38 PM 03/11/2023    1:10 PM 01/22/2023    8:38 AM 04/22/2022   10:32 AM 04/08/2022    4:09 PM 03/20/2022   11:25 AM 02/05/2022    9:19 AM  PHQ 2/9 Scores  PHQ - 2 Score 0 0 0 0 0  0  PHQ- 9 Score 0 0 1 0 6  0  Exception Documentation      Patient refusal     Fall Risk    03/25/2023    1:40 PM 03/11/2023    1:10 PM 01/22/2023  8:38 AM 04/08/2022    4:09 PM 01/02/2022    9:21 AM  Fall Risk   Falls in the past year? 1 0 0 0 0  Number falls in past yr: 0 0 0 0 0  Injury with Fall? 1 1 0 0 0  Comment worsened rotator cuff injury      Risk for fall due to : History of fall(s) No Fall Risks No Fall Risks No Fall Risks No Fall Risks  Follow up Falls prevention discussed;Falls evaluation completed Falls evaluation completed Falls evaluation completed Falls evaluation completed Falls evaluation completed    MEDICARE RISK AT HOME:  Medicare Risk at Home - 03/25/23 1341     Any stairs in or around the home? Yes    If so, are there any without handrails? No    Home free of loose throw rugs in walkways, pet beds, electrical cords, etc? Yes    Adequate lighting in your home to reduce risk of falls? Yes    Life alert? No    Use of a cane, walker or w/c? No    Grab bars in the bathroom? Yes    Shower chair or bench in shower? Yes    Elevated toilet seat or a handicapped toilet? No             TIMED UP AND GO:  Was the test performed?  Yes  Length of time to ambulate 10 feet: 4 sec Gait steady and fast without use of assistive device    Cognitive Function:        03/25/2023    1:42 PM 03/20/2022   11:08 AM  6CIT Screen  What Year? 0 points 0 points  What month? 0 points 0 points  What time? 0 points 0 points  Count back from 20 0 points 0 points  Months in reverse 4 points 4 points  Repeat phrase 0 points   Total Score 4 points     Immunizations Immunization History  Administered Date(s) Administered   Fluad  Quad(high Dose 65+) 05/24/2022   PNEUMOCOCCAL CONJUGATE-20 05/24/2022   Td 05/24/2022    TDAP status: Up to date  Flu Vaccine status: Up to date  Pneumococcal vaccine status: Up to date  Covid-19 vaccine status: Completed vaccines- per patient, on his passport  Qualifies for Shingles Vaccine? Yes   Zostavax completed No   Shingrix Completed?: No.    Education has been provided regarding the importance of this vaccine. Patient has been advised to call insurance company to determine out of pocket expense if they have not yet received this vaccine. Advised may also receive vaccine at local pharmacy or Health Dept. Verbalized acceptance and understanding.  Screening Tests Health Maintenance  Topic Date Due   COVID-19 Vaccine (1 - 2023-24 season) 03/27/2023 (Originally 04/26/2022)   Zoster Vaccines- Shingrix (1 of 2) 06/11/2023 (Originally 11/25/1995)   INFLUENZA VACCINE  03/27/2023   Lung Cancer Screening  02/11/2024   Medicare Annual Wellness (AWV)  03/24/2024   DTaP/Tdap/Td (2 - Tdap) 05/24/2032   Pneumonia Vaccine 37+ Years old  Completed   Hepatitis C Screening  Completed   HPV VACCINES  Aged Out    Health Maintenance  There are no preventive care reminders to display for this patient.   Colorectal cancer screening: No longer required.   Lung Cancer Screening: (Low Dose CT Chest recommended if Age 73-80 years, 20 pack-year currently smoking OR have quit w/in 15years.) does qualify.   Lung Cancer Screening Referral: 02/11/23  Additional Screening:  Hepatitis C Screening: does qualify; Completed 01/02/22  Vision Screening: Recommended annual ophthalmology exams for early detection of glaucoma and other disorders of the eye. Is the patient up to date with their annual eye exam?  No  Who is the provider or what is the name of the office in which the patient attends annual eye exams? No one If pt is not established with a provider, would they like to be referred to a provider to  establish care? No .   Dental Screening: Recommended annual dental exams for proper oral hygiene   Community Resource Referral / Chronic Care Management: CRR required this visit?  No   CCM required this visit?  No     Plan:     I have personally reviewed and noted the following in the patient's chart:   Medical and social history Use of alcohol, tobacco or illicit drugs  Current medications and supplements including opioid prescriptions. Patient is not currently taking opioid prescriptions. Functional ability and status Nutritional status Physical activity Advanced directives List of other physicians Hospitalizations, surgeries, and ER visits in previous 12 months Vitals Screenings to include cognitive, depression, and falls Referrals and appointments  In addition, I have reviewed and discussed with patient certain preventive protocols, quality metrics, and best practice recommendations. A written personalized care plan for preventive services as well as general preventive health recommendations were provided to patient.     Hal Hope, LPN   02/13/3085   After Visit Summary: (MyChart) Due to this being a telephonic visit, the after visit summary with patients personalized plan was offered to patient via MyChart   Nurse Notes: none

## 2023-03-25 NOTE — Patient Instructions (Addendum)
Mr. Connor Holmes , Thank you for taking time to come for your Medicare Wellness Visit. I appreciate your ongoing commitment to your health goals. Please review the following plan we discussed and let me know if I can assist you in the future.   Referrals/Orders/Follow-Ups/Clinician Recommendations: none  This is a list of the screening recommended for you and due dates:  Health Maintenance  Topic Date Due   COVID-19 Vaccine (1 - 2023-24 season) 03/27/2023*   Zoster (Shingles) Vaccine (1 of 2) 06/11/2023*   Flu Shot  03/27/2023   Screening for Lung Cancer  02/11/2024   Medicare Annual Wellness Visit  03/24/2024   DTaP/Tdap/Td vaccine (2 - Tdap) 05/24/2032   Pneumonia Vaccine  Completed   Hepatitis C Screening  Completed   HPV Vaccine  Aged Out  *Topic was postponed. The date shown is not the original due date.    Advanced directives: (Declined) Advance directive discussed with you today. Even though you declined this today, please call our office should you change your mind, and we can give you the proper paperwork for you to fill out.  Next Medicare Annual Wellness Visit scheduled for next year: Yes  03/30/24 @ 11:30 am in person  Preventive Care 65 Years and Older, Male  Preventive care refers to lifestyle choices and visits with your health care provider that can promote health and wellness. What does preventive care include? A yearly physical exam. This is also called an annual well check. Dental exams once or twice a year. Routine eye exams. Ask your health care provider how often you should have your eyes checked. Personal lifestyle choices, including: Daily care of your teeth and gums. Regular physical activity. Eating a healthy diet. Avoiding tobacco and drug use. Limiting alcohol use. Practicing safe sex. Taking low doses of aspirin every day. Taking vitamin and mineral supplements as recommended by your health care provider. What happens during an annual well check? The  services and screenings done by your health care provider during your annual well check will depend on your age, overall health, lifestyle risk factors, and family history of disease. Counseling  Your health care provider may ask you questions about your: Alcohol use. Tobacco use. Drug use. Emotional well-being. Home and relationship well-being. Sexual activity. Eating habits. History of falls. Memory and ability to understand (cognition). Work and work Astronomer. Screening  You may have the following tests or measurements: Height, weight, and BMI. Blood pressure. Lipid and cholesterol levels. These may be checked every 5 years, or more frequently if you are over 25 years old. Skin check. Lung cancer screening. You may have this screening every year starting at age 77 if you have a 30-pack-year history of smoking and currently smoke or have quit within the past 15 years. Fecal occult blood test (FOBT) of the stool. You may have this test every year starting at age 50. Flexible sigmoidoscopy or colonoscopy. You may have a sigmoidoscopy every 5 years or a colonoscopy every 10 years starting at age 77. Prostate cancer screening. Recommendations will vary depending on your family history and other risks. Hepatitis C blood test. Hepatitis B blood test. Sexually transmitted disease (STD) testing. Diabetes screening. This is done by checking your blood sugar (glucose) after you have not eaten for a while (fasting). You may have this done every 1-3 years. Abdominal aortic aneurysm (AAA) screening. You may need this if you are a current or former smoker. Osteoporosis. You may be screened starting at age 77 if you are  at high risk. Talk with your health care provider about your test results, treatment options, and if necessary, the need for more tests. Vaccines  Your health care provider may recommend certain vaccines, such as: Influenza vaccine. This is recommended every year. Tetanus,  diphtheria, and acellular pertussis (Tdap, Td) vaccine. You may need a Td booster every 10 years. Zoster vaccine. You may need this after age 77. Pneumococcal 13-valent conjugate (PCV13) vaccine. One dose is recommended after age 77. Pneumococcal polysaccharide (PPSV23) vaccine. One dose is recommended after age 77. Talk to your health care provider about which screenings and vaccines you need and how often you need them. This information is not intended to replace advice given to you by your health care provider. Make sure you discuss any questions you have with your health care provider. Document Released: 09/08/2015 Document Revised: 05/01/2016 Document Reviewed: 06/13/2015 Elsevier Interactive Patient Education  2017 [employer].  Fall Prevention in the Home Falls can cause injuries. They can happen to people of all ages. There are many things you can do to make your home safe and to help prevent falls. What can I do on the outside of my home? Regularly fix the edges of walkways and driveways and fix any cracks. Remove anything that might make you trip as you walk through a door, such as a raised step or threshold. Trim any bushes or trees on the path to your home. Use bright outdoor lighting. Clear any walking paths of anything that might make someone trip, such as rocks or tools. Regularly check to see if handrails are loose or broken. Make sure that both sides of any steps have handrails. Any raised decks and porches should have guardrails on the edges. Have any leaves, snow, or ice cleared regularly. Use sand or salt on walking paths during winter. Clean up any spills in your garage right away. This includes oil or grease spills. What can I do in the bathroom? Use night lights. Install grab bars by the toilet and in the tub and shower. Do not use towel bars as grab bars. Use non-skid mats or decals in the tub or shower. If you need to sit down in the shower, use a plastic,  non-slip stool. Keep the floor dry. Clean up any water that spills on the floor as soon as it happens. Remove soap buildup in the tub or shower regularly. Attach bath mats securely with double-sided non-slip rug tape. Do not have throw rugs and other things on the floor that can make you trip. What can I do in the bedroom? Use night lights. Make sure that you have a light by your bed that is easy to reach. Do not use any sheets or blankets that are too big for your bed. They should not hang down onto the floor. Have a firm chair that has side arms. You can use this for support while you get dressed. Do not have throw rugs and other things on the floor that can make you trip. What can I do in the kitchen? Clean up any spills right away. Avoid walking on wet floors. Keep items that you use a lot in easy-to-reach places. If you need to reach something above you, use a strong step stool that has a grab bar. Keep electrical cords out of the way. Do not use floor polish or wax that makes floors slippery. If you must use wax, use non-skid floor wax. Do not have throw rugs and other things on the floor  that can make you trip. What can I do with my stairs? Do not leave any items on the stairs. Make sure that there are handrails on both sides of the stairs and use them. Fix handrails that are broken or loose. Make sure that handrails are as long as the stairways. Check any carpeting to make sure that it is firmly attached to the stairs. Fix any carpet that is loose or worn. Avoid having throw rugs at the top or bottom of the stairs. If you do have throw rugs, attach them to the floor with carpet tape. Make sure that you have a light switch at the top of the stairs and the bottom of the stairs. If you do not have them, ask someone to add them for you. What else can I do to help prevent falls? Wear shoes that: Do not have high heels. Have rubber bottoms. Are comfortable and fit you well. Are closed  at the toe. Do not wear sandals. If you use a stepladder: Make sure that it is fully opened. Do not climb a closed stepladder. Make sure that both sides of the stepladder are locked into place. Ask someone to hold it for you, if possible. Clearly mark and make sure that you can see: Any grab bars or handrails. First and last steps. Where the edge of each step is. Use tools that help you move around (mobility aids) if they are needed. These include: Canes. Walkers. Scooters. Crutches. Turn on the lights when you go into a dark area. Replace any light bulbs as soon as they burn out. Set up your furniture so you have a clear path. Avoid moving your furniture around. If any of your floors are uneven, fix them. If there are any pets around you, be aware of where they are. Review your medicines with your doctor. Some medicines can make you feel dizzy. This can increase your chance of falling. Ask your doctor what other things that you can do to help prevent falls. This information is not intended to replace advice given to you by your health care provider. Make sure you discuss any questions you have with your health care provider. Document Released: 06/08/2009 Document Revised: 01/18/2016 Document Reviewed: 09/16/2014 Elsevier Interactive Patient Education  2017 [employer].

## 2023-03-27 ENCOUNTER — Encounter: Payer: Self-pay | Admitting: Family Medicine

## 2023-03-27 ENCOUNTER — Ambulatory Visit (INDEPENDENT_AMBULATORY_CARE_PROVIDER_SITE_OTHER): Payer: Medicare PPO | Admitting: Family Medicine

## 2023-03-27 VITALS — BP 151/85 | HR 96 | Temp 97.7°F | Wt 202.6 lb

## 2023-03-27 DIAGNOSIS — B370 Candidal stomatitis: Secondary | ICD-10-CM

## 2023-03-27 DIAGNOSIS — J439 Emphysema, unspecified: Secondary | ICD-10-CM | POA: Diagnosis not present

## 2023-03-27 DIAGNOSIS — J029 Acute pharyngitis, unspecified: Secondary | ICD-10-CM | POA: Diagnosis not present

## 2023-03-27 MED ORDER — LIDOCAINE VISCOUS HCL 2 % MT SOLN: 5.0000 mL | Freq: Three times a day (TID) | OROMUCOSAL | 0 refills | Status: DC | PRN

## 2023-03-27 MED ORDER — TRELEGY ELLIPTA 100-62.5-25 MCG/ACT IN AEPB: 1.0000 | INHALATION_SPRAY | Freq: Every day | RESPIRATORY_TRACT | 11 refills | Status: DC

## 2023-03-27 NOTE — Assessment & Plan Note (Signed)
Thrush on breztri- will change to trelegy. Recheck as scheduled.

## 2023-03-27 NOTE — Progress Notes (Signed)
BP (!) 151/85   Pulse 96   Temp 97.7 F (36.5 C) (Oral)   Wt 202 lb 9.6 oz (91.9 kg)   SpO2 (!) 78%   BMI 29.92 kg/m    Subjective:    Patient ID: Connor Holmes, male    DOB: May 07, 1946, 77 y.o.   MRN: [id number]  HPI: Connor Holmes is a 77 y.o. male  Chief Complaint  Patient presents with   Sore Throat    Has had the sore throat for 3 days and has been very weak   UPPER RESPIRATORY TRACT INFECTION Duration: 3 days Worst symptom: fatigue, sore throat Fever: no Cough: yes Shortness of breath: no Wheezing: no Chest pain: no Chest tightness: yes Chest congestion: no Nasal congestion: no Runny nose: no Post nasal drip: no Sneezing: no Sore throat: no Swollen glands: no Sinus pressure: no Headache: no Face pain: no Toothache: no Ear pain: no  Ear pressure: no  Eyes red/itching:no Eye drainage/crusting: no  Vomiting: no Rash: no Fatigue: yes Sick contacts: no Strep contacts: no  Context: worse Recurrent sinusitis: no Relief with OTC cold/cough medications: no  Treatments attempted: none    Relevant past medical, surgical, family and social history reviewed and updated as indicated. Interim medical history since our last visit reviewed. Allergies and medications reviewed and updated.  Review of Systems  Constitutional:  Positive for fatigue. Negative for activity change, appetite change, chills, diaphoresis, fever and unexpected weight change.  HENT: Negative.    Respiratory: Negative.    Cardiovascular: Negative.   Gastrointestinal: Negative.   Musculoskeletal: Negative.   Psychiatric/Behavioral: Negative.      Per HPI unless specifically indicated above     Objective:    BP (!) 151/85   Pulse 96   Temp 97.7 F (36.5 C) (Oral)   Wt 202 lb 9.6 oz (91.9 kg)   SpO2 (!) 78%   BMI 29.92 kg/m   Wt Readings from Last 3 Encounters:  03/27/23 202 lb 9.6 oz (91.9 kg)  03/25/23 202 lb 3.2 oz (91.7 kg)  03/11/23 203 lb 3.2 oz (92.2 kg)    Physical  Exam Vitals and nursing note reviewed.  Constitutional:      General: He is not in acute distress.    Appearance: Normal appearance. He is well-developed and normal weight. He is not ill-appearing, toxic-appearing or diaphoretic.  HENT:     Head: Normocephalic and atraumatic.     Right Ear: Tympanic membrane, ear canal and external ear normal.     Left Ear: Tympanic membrane, ear canal and external ear normal.     Nose: Nose normal.     Mouth/Throat:     Mouth: Mucous membranes are moist.     Pharynx: Oropharynx is clear.  Eyes:     General: No scleral icterus.       Right eye: No discharge.        Left eye: No discharge.     Extraocular Movements: Extraocular movements intact.     Conjunctiva/sclera: Conjunctivae normal.     Pupils: Pupils are equal, round, and reactive to light.  Cardiovascular:     Rate and Rhythm: Normal rate and regular rhythm.     Pulses: Normal pulses.     Heart sounds: Normal heart sounds. No murmur heard.    No friction rub. No gallop.  Pulmonary:     Effort: Pulmonary effort is normal. No respiratory distress.     Breath sounds: Normal breath sounds. No stridor. No wheezing, rhonchi  or rales.  Chest:     Chest wall: No tenderness.  Musculoskeletal:        General: Normal range of motion.     Cervical back: Normal range of motion and neck supple.  Skin:    General: Skin is warm and dry.     Capillary Refill: Capillary refill takes less than 2 seconds.     Coloration: Skin is not jaundiced or pale.     Findings: No bruising, erythema, lesion or rash.  Neurological:     General: No focal deficit present.     Mental Status: He is alert and oriented to person, place, and time. Mental status is at baseline.  Psychiatric:        Mood and Affect: Mood normal.        Behavior: Behavior normal.        Thought Content: Thought content normal.        Judgment: Judgment normal.     Results for orders placed or performed in visit on 03/27/23  Rapid Strep  screen([facility])   Specimen: Other   Other  Result Value Ref Range   Strep Gp A Ag, IA W/Reflex Negative Negative  Culture, Group A Strep   Other  Result Value Ref Range   Strep A Culture WILL FOLLOW       Assessment & Plan:   Problem List Items Addressed This Visit       Respiratory   Pulmonary emphysema (HCC)    Thrush on breztri- will change to trelegy. Recheck as scheduled.       Relevant Medications   Fluticasone-Umeclidin-Vilant (TRELEGY ELLIPTA) 100-62.5-25 MCG/ACT AEPB   magic mouthwash (lidocaine, diphenhydrAMINE, alum & mag hydroxide) suspension   Other Visit Diagnoses     Sore throat    -  Primary   Will check for covid. Strep negative. Concern for thrush. Will treat with magic mouth wash. Call if not getting better or getting worse.   Relevant Orders   Rapid Strep screen([facility]) (Completed)   Novel Coronavirus, NAA ([facility])   Oral candida       Will treat with magic mouth wash. Call with any concerns or if not getting better.        Follow up plan: Return as scheduled.

## 2023-03-28 ENCOUNTER — Encounter: Payer: Self-pay | Admitting: Family Medicine

## 2023-04-03 ENCOUNTER — Ambulatory Visit: Payer: Medicare PPO | Admitting: Family Medicine

## 2023-04-04 ENCOUNTER — Other Ambulatory Visit: Payer: Self-pay | Admitting: Internal Medicine

## 2023-04-04 ENCOUNTER — Encounter: Payer: Self-pay | Admitting: Orthopedic Surgery

## 2023-04-04 ENCOUNTER — Ambulatory Visit: Payer: Self-pay

## 2023-04-04 ENCOUNTER — Other Ambulatory Visit
Admission: RE | Admit: 2023-04-04 | Discharge: 2023-04-04 | Disposition: A | Discharge status: Home / Self Care | Payer: Medicare PPO | Source: Ambulatory Visit | Attending: Internal Medicine | Admitting: Internal Medicine

## 2023-04-04 DIAGNOSIS — Z01812 Encounter for preprocedural laboratory examination: Secondary | ICD-10-CM | POA: Insufficient documentation

## 2023-04-04 DIAGNOSIS — E782 Mixed hyperlipidemia: Secondary | ICD-10-CM | POA: Insufficient documentation

## 2023-04-04 DIAGNOSIS — I10 Essential (primary) hypertension: Secondary | ICD-10-CM

## 2023-04-04 DIAGNOSIS — I2510 Atherosclerotic heart disease of native coronary artery without angina pectoris: Secondary | ICD-10-CM | POA: Diagnosis present

## 2023-04-04 DIAGNOSIS — R9439 Abnormal result of other cardiovascular function study: Secondary | ICD-10-CM | POA: Diagnosis present

## 2023-04-04 DIAGNOSIS — Z9889 Other specified postprocedural states: Secondary | ICD-10-CM | POA: Diagnosis present

## 2023-04-04 LAB — BRAIN NATRIURETIC PEPTIDE: B Natriuretic Peptide: 16.5 pg/mL (ref 0.0–100.0)

## 2023-04-04 MED ORDER — AMOXICILLIN-POT CLAVULANATE 875-125 MG PO TABS: 1.0000 | ORAL_TABLET | Freq: Two times a day (BID) | ORAL | 0 refills | Status: DC

## 2023-04-04 NOTE — Telephone Encounter (Signed)
Message from Connor Holmes sent at 04/04/2023  9:34 AM EDT  Summary: still experiencing sore throat while taking medication   Patient called and stated he saw Dr Laural Benes on 8.1.2024 for a sore throat. Dr Laural Benes prescribed him some medication that he has been using that he gargles with and he stated the sore throat has got a little better but it is not gone. He is still experiencing the sore throat. Please advise.  Patients callback # [phone]         Chief Complaint: sore throat Symptoms: pt asked to get off phone was in appt  Frequency: seen in office 03/27/23 Pertinent Negatives: Patient denies fever Disposition: [] ED /[] Urgent Care (no appt availability in office) / [] Appointment(In office/virtual)/ []  [facility]/ [] Home Care/ [] Refused Recommended Disposition /[] [facility]/ [x]  Follow-up with PCP Additional Notes: routing to PCP office  Reason for Disposition  [1] Sore throat is the only symptom AND [2] present > 48 hours  Answer Assessment - Initial Assessment Questions 1. ONSET: "When did the throat start hurting?" (Hours or days ago)      03/26/23 2. SEVERITY: "How bad is the sore throat?" (Scale 1-10; mild, moderate or severe)   - MILD (1-3):  Doesn't interfere with eating or normal activities.   - MODERATE (4-7): Interferes with eating some solids and normal activities.   - SEVERE (8-10):  Excruciating pain, interferes with most normal activities.   - SEVERE WITH DYSPHAGIA (10): Can't swallow liquids, drooling.     N/a- pt rushed off triage due to in docteor's appt 5. FEVER: "Do you have a fever?" If Yes, ask: "What is your temperature, how was it measured, and when did it start?"     No  7. OTHER SYMPTOMS: "Do you have any other symptoms?" (e.g., difficulty breathing, headache, rash)     none  Protocols used: Sore Throat-A-AH

## 2023-04-04 NOTE — Telephone Encounter (Signed)
Attempted to reach pt. No answer sent MyChart Message.

## 2023-04-04 NOTE — Telephone Encounter (Signed)
Antibiotic sent to his pharmacy.

## 2023-04-04 NOTE — Addendum Note (Signed)
Addended by: Dorcas Carrow on: 04/04/2023 04:35 PM   Modules accepted: Orders

## 2023-04-05 ENCOUNTER — Other Ambulatory Visit: Payer: Medicare PPO

## 2023-04-11 ENCOUNTER — Ambulatory Visit: Admission: RE | Admit: 2023-04-11 | Payer: Medicare PPO | Source: Ambulatory Visit

## 2023-04-16 ENCOUNTER — Encounter: Payer: Self-pay | Admitting: Internal Medicine

## 2023-04-16 ENCOUNTER — Ambulatory Visit
Admission: RE | Admit: 2023-04-16 | Discharge: 2023-04-16 | Disposition: A | Discharge status: Home / Self Care | Payer: Medicare PPO | Attending: Internal Medicine | Admitting: Internal Medicine

## 2023-04-16 ENCOUNTER — Other Ambulatory Visit: Payer: Self-pay

## 2023-04-16 ENCOUNTER — Encounter
Admission: RE | Disposition: A | Discharge status: Home / Self Care | Payer: Self-pay | Source: Home / Self Care | Attending: Internal Medicine

## 2023-04-16 DIAGNOSIS — I2582 Chronic total occlusion of coronary artery: Secondary | ICD-10-CM | POA: Diagnosis not present

## 2023-04-16 DIAGNOSIS — R9439 Abnormal result of other cardiovascular function study: Secondary | ICD-10-CM | POA: Diagnosis present

## 2023-04-16 DIAGNOSIS — I2510 Atherosclerotic heart disease of native coronary artery without angina pectoris: Secondary | ICD-10-CM | POA: Insufficient documentation

## 2023-04-16 DIAGNOSIS — R9430 Abnormal result of cardiovascular function study, unspecified: Secondary | ICD-10-CM

## 2023-04-16 HISTORY — PX: LEFT HEART CATH AND CORONARY ANGIOGRAPHY: CATH118249

## 2023-04-16 SURGERY — LEFT HEART CATH AND CORONARY ANGIOGRAPHY
Anesthesia: Moderate Sedation | Laterality: Left

## 2023-04-16 MED ORDER — SODIUM CHLORIDE 0.9 % WEIGHT BASED INFUSION: 1.0000 mL/kg/h | INTRAVENOUS | Status: DC

## 2023-04-16 MED ORDER — LABETALOL HCL 5 MG/ML IV SOLN: 10.0000 mg | INTRAVENOUS | Status: DC | PRN

## 2023-04-16 MED ORDER — ACETAMINOPHEN 325 MG PO TABS: 650.0000 mg | ORAL_TABLET | ORAL | Status: DC | PRN

## 2023-04-16 MED ORDER — VERAPAMIL HCL 2.5 MG/ML IV SOLN
INTRAVENOUS | Status: DC | PRN
  Administered 2023-04-16: 2.5 mg via INTRAVENOUS

## 2023-04-16 MED ORDER — MIDAZOLAM HCL 2 MG/2ML IJ SOLN
INTRAMUSCULAR | Status: AC
  Filled 2023-04-16: qty 2

## 2023-04-16 MED ORDER — IOHEXOL 300 MG/ML  SOLN
INTRAMUSCULAR | Status: DC | PRN
  Administered 2023-04-16: 47 mL

## 2023-04-16 MED ORDER — HEPARIN (PORCINE) IN NACL 2000-0.9 UNIT/L-% IV SOLN
INTRAVENOUS | Status: DC | PRN
  Administered 2023-04-16: 1000 mL

## 2023-04-16 MED ORDER — SODIUM CHLORIDE 0.9 % IV SOLN: 250.0000 mL | INTRAVENOUS | Status: DC | PRN

## 2023-04-16 MED ORDER — VERAPAMIL HCL 2.5 MG/ML IV SOLN
INTRAVENOUS | Status: AC
  Filled 2023-04-16: qty 2

## 2023-04-16 MED ORDER — LIDOCAINE HCL (PF) 1 % IJ SOLN
INTRAMUSCULAR | Status: DC | PRN
  Administered 2023-04-16: 30 mL

## 2023-04-16 MED ORDER — HEPARIN (PORCINE) IN NACL 1000-0.9 UT/500ML-% IV SOLN
INTRAVENOUS | Status: AC
  Filled 2023-04-16: qty 1000

## 2023-04-16 MED ORDER — LIDOCAINE HCL 1 % IJ SOLN
INTRAMUSCULAR | Status: AC
  Filled 2023-04-16: qty 20

## 2023-04-16 MED ORDER — FENTANYL CITRATE (PF) 100 MCG/2ML IJ SOLN
INTRAMUSCULAR | Status: AC
  Filled 2023-04-16: qty 2

## 2023-04-16 MED ORDER — ASPIRIN 81 MG PO CHEW: 81.0000 mg | CHEWABLE_TABLET | ORAL | Status: AC

## 2023-04-16 MED ORDER — ONDANSETRON HCL 4 MG/2ML IJ SOLN: 4.0000 mg | Freq: Four times a day (QID) | INTRAMUSCULAR | Status: DC | PRN

## 2023-04-16 MED ORDER — MIDAZOLAM HCL 2 MG/2ML IJ SOLN
INTRAMUSCULAR | Status: DC | PRN
  Administered 2023-04-16: 2 mg via INTRAVENOUS

## 2023-04-16 MED ORDER — HEPARIN SODIUM (PORCINE) 1000 UNIT/ML IJ SOLN
INTRAMUSCULAR | Status: DC | PRN
  Administered 2023-04-16: 4500 [IU] via INTRAVENOUS

## 2023-04-16 MED ORDER — SODIUM CHLORIDE 0.9 % WEIGHT BASED INFUSION
3.0000 mL/kg/h | INTRAVENOUS | Status: AC
  Administered 2023-04-16: 3 mL/kg/h via INTRAVENOUS

## 2023-04-16 MED ORDER — SODIUM CHLORIDE 0.9% FLUSH: 3.0000 mL | Freq: Two times a day (BID) | INTRAVENOUS | Status: DC

## 2023-04-16 MED ORDER — HEPARIN SODIUM (PORCINE) 1000 UNIT/ML IJ SOLN
INTRAMUSCULAR | Status: AC
  Filled 2023-04-16: qty 10

## 2023-04-16 MED ORDER — HYDRALAZINE HCL 20 MG/ML IJ SOLN: 10.0000 mg | INTRAMUSCULAR | Status: DC | PRN

## 2023-04-16 MED ORDER — ASPIRIN 81 MG PO CHEW
CHEWABLE_TABLET | ORAL | Status: AC
  Administered 2023-04-16: 81 mg via ORAL
  Filled 2023-04-16: qty 1

## 2023-04-16 MED ORDER — SODIUM CHLORIDE 0.9% FLUSH: 3.0000 mL | INTRAVENOUS | Status: DC | PRN

## 2023-04-16 SURGICAL SUPPLY — 10 items
CATH 5FR JL3.5 JR4 ANG PIG MP (CATHETERS) IMPLANT
DEVICE RAD TR BAND REGULAR (VASCULAR PRODUCTS) IMPLANT
DRAPE BRACHIAL (DRAPES) IMPLANT
GLIDESHEATH SLEND SS 6F .021 (SHEATH) IMPLANT
GUIDEWIRE INQWIRE 1.5J.035X260 (WIRE) IMPLANT
INQWIRE 1.5J .035X260CM (WIRE) ×1
PACK CARDIAC CATH (CUSTOM PROCEDURE TRAY) ×1 IMPLANT
PROTECTION STATION PRESSURIZED (MISCELLANEOUS) ×1
SET ATX-X65L (MISCELLANEOUS) IMPLANT
STATION PROTECTION PRESSURIZED (MISCELLANEOUS) IMPLANT

## 2023-04-16 NOTE — Discharge Instructions (Signed)
 Keep right wrist elevated on pillow above the heart for today.  Watch right wrist for evidence of bleeding or hematoma.. If bleeding or hematoma noted, hold pressure over the site for at least 15 minutes and notify the physician.  No blending or flexing of the wrist--no lifting for the remainder of the day or for 2 weeks after your procedure. Notify the physician for evidence of infection or if you get a temperature.   Radial Site Care Refer to this sheet in the next few weeks. These instructions provide you with information about caring for yourself after your procedure. Your health care provider may also give you more specific instructions. Your treatment has been planned according to current medical practices, but problems sometimes occur. Call your health care provider if you have any problems or questions after your procedure. What can I expect after the procedure? After your procedure, it is typical to have the following: Bruising at the radial site that usually fades within 1-2 weeks. Blood collecting in the tissue (hematoma) that may be painful to the touch. It should usually decrease in size and tenderness within 1-2 weeks.  Follow these instructions at home: Take medicines only as directed by your health care provider. If you are on a medication called Metformin please do not take for 48 hours after your procedure. Over the next 48hrs please increase your fluid intake of water and non caffeine beverages to flush the contrast dye out of your system.  You may shower 24 hours after the procedure  Leave your bandage on and gently wash the site with plain soap and water. Pat the area dry with a clean towel. Do not rub the site, because this may cause bleeding.  Remove your dressing 48hrs after your procedure and leave open to air.  Do not submerge your site in water for 7 days. This includes swimming and washing dishes.  Check your insertion site every day for redness, swelling, or drainage. Do  not apply powder or lotion to the site. Do not flex or bend the affected arm for 24 hours or as directed by your health care provider. Do not push or pull heavy objects with the affected arm for 24 hours or as directed by your health care provider. Do not lift over 10 lb (4.5 kg) for 5 days after your procedure or as directed by your health care provider. Ask your health care provider when it is okay to: Return to work or school. Resume usual physical activities or sports. Resume sexual activity. Do not drive home if you are discharged the same day as the procedure. Have someone else drive you. You may drive 48 hours after the procedure Do not operate machinery or power tools for 24 hours after the procedure. If your procedure was done as an outpatient procedure, which means that you went home the same day as your procedure, a responsible adult should be with you for the first 24 hours after you arrive home. Keep all follow-up visits as directed by your health care provider. This is important. Contact a health care provider if: You have a fever. You have chills. You have increased bleeding from the radial site. Hold pressure on the site. Get help right away if: You have unusual pain at the radial site. You have redness, warmth, or swelling at the radial site. You have drainage (other than a small amount of blood on the dressing) from the radial site. The radial site is bleeding, and the bleeding does not  stop after 15 minutes of holding steady pressure on the site. Your arm or hand becomes pale, cool, tingly, or numb. This information is not intended to replace advice given to you by your health care provider. Make sure you discuss any questions you have with your health care provider. Document Released: 09/14/2010 Document Revised: 01/18/2016 Document Reviewed: 02/28/2014 Elsevier Interactive Patient Education  2018 [employer].

## 2023-04-17 ENCOUNTER — Encounter: Payer: Self-pay | Admitting: Internal Medicine

## 2023-04-18 ENCOUNTER — Ambulatory Visit
Admission: RE | Admit: 2023-04-18 | Discharge: 2023-04-18 | Disposition: A | Discharge status: Home / Self Care | Payer: Medicare PPO | Source: Ambulatory Visit | Attending: Internal Medicine | Admitting: Internal Medicine

## 2023-04-18 DIAGNOSIS — I10 Essential (primary) hypertension: Secondary | ICD-10-CM | POA: Insufficient documentation

## 2023-04-18 DIAGNOSIS — Z9889 Other specified postprocedural states: Secondary | ICD-10-CM | POA: Insufficient documentation

## 2023-04-18 MED ORDER — IOHEXOL 350 MG/ML SOLN
100.0000 mL | Freq: Once | INTRAVENOUS | Status: AC | PRN
  Administered 2023-04-18: 100 mL via INTRAVENOUS

## 2023-04-21 ENCOUNTER — Other Ambulatory Visit: Payer: Self-pay | Admitting: Orthopedic Surgery

## 2023-04-21 DIAGNOSIS — M25311 Other instability, right shoulder: Secondary | ICD-10-CM

## 2023-04-21 DIAGNOSIS — G8929 Other chronic pain: Secondary | ICD-10-CM

## 2023-04-21 DIAGNOSIS — S46001A Unspecified injury of muscle(s) and tendon(s) of the rotator cuff of right shoulder, initial encounter: Secondary | ICD-10-CM

## 2023-04-23 ENCOUNTER — Ambulatory Visit
Admission: RE | Admit: 2023-04-23 | Discharge: 2023-04-23 | Disposition: A | Discharge status: Home / Self Care | Payer: Medicare PPO | Source: Ambulatory Visit | Attending: Orthopedic Surgery | Admitting: Orthopedic Surgery

## 2023-04-23 DIAGNOSIS — S46001A Unspecified injury of muscle(s) and tendon(s) of the rotator cuff of right shoulder, initial encounter: Secondary | ICD-10-CM | POA: Diagnosis present

## 2023-04-23 DIAGNOSIS — M25311 Other instability, right shoulder: Secondary | ICD-10-CM | POA: Diagnosis present

## 2023-04-23 DIAGNOSIS — M25511 Pain in right shoulder: Secondary | ICD-10-CM | POA: Diagnosis present

## 2023-04-23 DIAGNOSIS — G8929 Other chronic pain: Secondary | ICD-10-CM | POA: Insufficient documentation

## 2023-04-24 ENCOUNTER — Ambulatory Visit: Payer: Medicare PPO | Admitting: Family Medicine

## 2023-05-06 ENCOUNTER — Ambulatory Visit (INDEPENDENT_AMBULATORY_CARE_PROVIDER_SITE_OTHER): Payer: Medicare PPO | Admitting: Family Medicine

## 2023-05-06 ENCOUNTER — Encounter: Payer: Self-pay | Admitting: Family Medicine

## 2023-05-06 VITALS — BP 154/85 | HR 61 | Temp 97.9°F | Wt 204.4 lb

## 2023-05-06 DIAGNOSIS — I723 Aneurysm of iliac artery: Secondary | ICD-10-CM | POA: Insufficient documentation

## 2023-05-06 DIAGNOSIS — D751 Secondary polycythemia: Secondary | ICD-10-CM

## 2023-05-06 DIAGNOSIS — J439 Emphysema, unspecified: Secondary | ICD-10-CM

## 2023-05-06 DIAGNOSIS — E782 Mixed hyperlipidemia: Secondary | ICD-10-CM

## 2023-05-06 DIAGNOSIS — I10 Essential (primary) hypertension: Secondary | ICD-10-CM

## 2023-05-06 DIAGNOSIS — Z23 Encounter for immunization: Secondary | ICD-10-CM

## 2023-05-06 DIAGNOSIS — R7301 Impaired fasting glucose: Secondary | ICD-10-CM

## 2023-05-06 LAB — BAYER DCA HB A1C WAIVED: HB A1C (BAYER DCA - WAIVED): 6.3 % — ABNORMAL HIGH (ref 4.8–5.6)

## 2023-05-06 LAB — MICROALBUMIN, URINE WAIVED
Creatinine, Urine Waived: 200 mg/dL (ref 10–300)
Microalb, Ur Waived: 10 mg/L (ref 0–19)
Microalb/Creat Ratio: <30 mg/g (ref <30)

## 2023-05-06 MED ORDER — HYDROCHLOROTHIAZIDE 25 MG PO TABS: 25.0000 mg | ORAL_TABLET | Freq: Every day | ORAL | Status: DC | PRN

## 2023-05-06 MED ORDER — FLUTICASONE PROPIONATE 50 MCG/ACT NA SUSP: 2.0000 | Freq: Every day | NASAL | 12 refills | Status: AC

## 2023-05-06 MED ORDER — ATORVASTATIN CALCIUM 40 MG PO TABS: 40.0000 mg | ORAL_TABLET | Freq: Every day | ORAL | 1 refills | Status: DC

## 2023-05-06 MED ORDER — METOPROLOL SUCCINATE ER 25 MG PO TB24: 25.0000 mg | ORAL_TABLET | Freq: Every day | ORAL | 3 refills | Status: DC

## 2023-05-06 MED ORDER — LOSARTAN POTASSIUM 25 MG PO TABS: 25.0000 mg | ORAL_TABLET | Freq: Every day | ORAL | 1 refills | Status: DC

## 2023-05-06 MED ORDER — LEVOCETIRIZINE DIHYDROCHLORIDE 5 MG PO TABS: 5.0000 mg | ORAL_TABLET | Freq: Every evening | ORAL | 1 refills | Status: AC

## 2023-05-06 NOTE — Assessment & Plan Note (Signed)
Did not start his trelegy. Rx sent to his pharmacy.

## 2023-05-06 NOTE — Assessment & Plan Note (Signed)
Referral to vascular placed today. Will try to get him in before he goes back to [place]

## 2023-05-06 NOTE — Assessment & Plan Note (Signed)
Took his medication late today. Will recheck in 2 weeks when he gets his covid shot and adjust as needed.

## 2023-05-06 NOTE — Progress Notes (Signed)
BP (!) 154/85   Pulse 61   Temp 97.9 F (36.6 C) (Oral)   Wt 204 lb 6.4 oz (92.7 kg)   SpO2 96%   BMI 30.18 kg/m    Subjective:    Patient ID: Connor Holmes, male    DOB: June 03, 1946, 77 y.o.   MRN: [id number]  HPI: Connor Holmes is a 77 y.o. male  Chief Complaint  Patient presents with   Hypertension   Since his last visit, he saw cardiology and had an ECHO which was grossly positive for ischemia. He had a catheterization done in the middle of August. He also had a CTA of his chest, abdomen and pelvis. 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 They recommended that he see vascular surgery about this.   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   HYPERTENSION / HYPERLIPIDEMIA Satisfied with current treatment? yes Duration of hypertension: chronic BP monitoring frequency: rarely BP medication side effects: no Past BP meds: hydrochlorothiazide, losartan, metoprolol Duration of hyperlipidemia: chronic Cholesterol medication side effects: no Cholesterol supplements: none Past cholesterol medications: atorvastatin Medication compliance: good compliance Aspirin: no Recent stressors: no Recurrent headaches: no Visual changes: no Palpitations: no Dyspnea: no Chest pain: no Lower extremity edema: no Dizzy/lightheaded: no  COPD COPD status: stable Satisfied with current treatment?: no Oxygen use: no Dyspnea frequency: rarely Cough frequency: occasionally Rescue inhaler frequency:  never Limitation of activity: no Productive cough: no Pneumovax: Up to Date Influenza: Up to Date   Relevant past medical, surgical, family and social history reviewed and updated as indicated. Interim medical history since our last visit reviewed. Allergies and  medications reviewed and updated.  Review of Systems  Constitutional: Negative.   HENT:  Positive for sore throat. Negative for congestion, dental problem, drooling, ear discharge, ear pain, facial swelling, hearing loss, mouth sores, nosebleeds, postnasal drip, rhinorrhea, sinus pressure, sinus pain, sneezing, tinnitus, trouble swallowing and voice change.   Respiratory: Negative.    Cardiovascular: Negative.   Gastrointestinal: Negative.   Musculoskeletal: Negative.   Neurological: Negative.   Psychiatric/Behavioral: Negative.      Per HPI unless specifically indicated above     Objective:    BP (!) 154/85   Pulse 61   Temp 97.9 F (36.6 C) (Oral)   Wt 204 lb 6.4 oz (92.7 kg)   SpO2 96%   BMI 30.18 kg/m   Wt Readings from Last 3 Encounters:  05/06/23 204 lb 6.4 oz (92.7 kg)  04/16/23 200 lb (90.7 kg)  03/27/23 202 lb 9.6 oz (91.9 kg)    Physical Exam Vitals and nursing note reviewed.  Constitutional:      General: He is not in acute distress.    Appearance: Normal appearance. He is not ill-appearing, toxic-appearing or diaphoretic.  HENT:     Head: Normocephalic and atraumatic.     Right Ear: External ear normal.     Left Ear: External ear normal.     Nose: Nose normal.     Mouth/Throat:     Mouth: Mucous membranes are moist.     Pharynx: Oropharynx is clear.  Eyes:     General: No scleral icterus.       Right eye: No discharge.        Left  eye: No discharge.     Extraocular Movements: Extraocular movements intact.     Conjunctiva/sclera: Conjunctivae normal.     Pupils: Pupils are equal, round, and reactive to light.  Cardiovascular:     Rate and Rhythm: Normal rate and regular rhythm.     Pulses: Normal pulses.     Heart sounds: Normal heart sounds. No murmur heard.    No friction rub. No gallop.  Pulmonary:     Effort: Pulmonary effort is normal. No respiratory distress.     Breath sounds: Normal breath sounds. No stridor. No wheezing, rhonchi or rales.   Chest:     Chest wall: No tenderness.  Musculoskeletal:        General: Normal range of motion.     Cervical back: Normal range of motion and neck supple.  Skin:    General: Skin is warm and dry.     Capillary Refill: Capillary refill takes less than 2 seconds.     Coloration: Skin is not jaundiced or pale.     Findings: No bruising, erythema, lesion or rash.  Neurological:     General: No focal deficit present.     Mental Status: He is alert and oriented to person, place, and time. Mental status is at baseline.  Psychiatric:        Mood and Affect: Mood normal.        Behavior: Behavior normal.        Thought Content: Thought content normal.        Judgment: Judgment normal.     Results for orders placed or performed during the hospital encounter of 04/04/23  Brain natriuretic peptide  Result Value Ref Range   B Natriuretic Peptide 16.5 0.0 - 100.0 pg/mL      Assessment & Plan:   Problem List Items Addressed This Visit       Cardiovascular and Mediastinum   Primary hypertension    Took his medication late today. Will recheck in 2 weeks when he gets his covid shot and adjust as needed.       Relevant Medications   atorvastatin (LIPITOR) 40 MG tablet   hydrochlorothiazide (HYDRODIURIL) 25 MG tablet   losartan (COZAAR) 25 MG tablet   metoprolol succinate (TOPROL-XL) 25 MG 24 hr tablet   Other Relevant Orders   Comprehensive metabolic panel   Microalbumin, Urine Waived   Iliac artery aneurysm, right (HCC) - Primary    Referral to vascular placed today. Will try to get him in before he goes back to [place]      Relevant Medications   atorvastatin (LIPITOR) 40 MG tablet   hydrochlorothiazide (HYDRODIURIL) 25 MG tablet   losartan (COZAAR) 25 MG tablet   metoprolol succinate (TOPROL-XL) 25 MG 24 hr tablet   Other Relevant Orders   Ambulatory referral to Vascular Surgery   Comprehensive metabolic panel     Respiratory   Pulmonary emphysema (HCC)    Did not  start his trelegy. Rx sent to his pharmacy.      Relevant Medications   fluticasone (FLONASE) 50 MCG/ACT nasal spray   levocetirizine (XYZAL) 5 MG tablet   Other Relevant Orders   Comprehensive metabolic panel     Endocrine   IFG (impaired fasting glucose)    Rechecking labs today. Await results.      Relevant Orders   Bayer DCA Hb A1c Waived     Other   Mixed hyperlipidemia    Under good control on current regimen. Continue current  regimen. Continue to monitor. Call with any concerns. Refills given. Labs drawn today.       Relevant Medications   atorvastatin (LIPITOR) 40 MG tablet   hydrochlorothiazide (HYDRODIURIL) 25 MG tablet   losartan (COZAAR) 25 MG tablet   metoprolol succinate (TOPROL-XL) 25 MG 24 hr tablet   Other Relevant Orders   Comprehensive metabolic panel   Lipid Panel w/o Chol/HDL Ratio   Polycythemia    Rechecking labs today. Await results.       Relevant Orders   CBC with Differential/Platelet   Comprehensive metabolic panel   Other Visit Diagnoses     Need for influenza vaccination       Relevant Orders   Flu Vaccine Trivalent High Dose (Fluad) (Completed)        Follow up plan: Return for 1 month if still here .

## 2023-05-06 NOTE — Assessment & Plan Note (Signed)
Under good control on current regimen. Continue current regimen. Continue to monitor. Call with any concerns. Refills given. Labs drawn today.   

## 2023-05-06 NOTE — Assessment & Plan Note (Signed)
Rechecking labs today. Await results.  

## 2023-05-07 ENCOUNTER — Other Ambulatory Visit: Payer: Self-pay | Admitting: Family Medicine

## 2023-05-07 DIAGNOSIS — N289 Disorder of kidney and ureter, unspecified: Secondary | ICD-10-CM

## 2023-05-07 LAB — CBC WITH DIFFERENTIAL/PLATELET
Basophils Absolute: 0.1 10*3/uL (ref 0.0–0.2)
Basos: 1 %
EOS (ABSOLUTE): 0.3 10*3/uL (ref 0.0–0.4)
Eos: 3 %
Hematocrit: 53.8 % — ABNORMAL HIGH (ref 37.5–51.0)
Hemoglobin: 18 g/dL — ABNORMAL HIGH (ref 13.0–17.7)
Immature Grans (Abs): 0 10*3/uL (ref 0.0–0.1)
Immature Granulocytes: 0 %
Lymphocytes Absolute: 2.5 10*3/uL (ref 0.7–3.1)
Lymphs: 27 %
MCH: 31.9 pg (ref 26.6–33.0)
MCHC: 33.5 g/dL (ref 31.5–35.7)
MCV: 95 fL (ref 79–97)
Monocytes Absolute: 0.9 10*3/uL (ref 0.1–0.9)
Monocytes: 10 %
Neutrophils Absolute: 5.4 10*3/uL (ref 1.4–7.0)
Neutrophils: 59 %
Platelets: 187 10*3/uL (ref 150–450)
RBC: 5.64 x10E6/uL (ref 4.14–5.80)
RDW: 12.8 % (ref 11.6–15.4)
WBC: 9.2 10*3/uL (ref 3.4–10.8)

## 2023-05-07 LAB — LIPID PANEL W/O CHOL/HDL RATIO
Cholesterol, Total: 152 mg/dL (ref 100–199)
HDL: 39 mg/dL — ABNORMAL LOW (ref >39)
LDL Chol Calc (NIH): 91 mg/dL (ref 0–99)
Triglycerides: 121 mg/dL (ref 0–149)
VLDL Cholesterol Cal: 22 mg/dL (ref 5–40)

## 2023-05-07 LAB — COMPREHENSIVE METABOLIC PANEL
ALT: 29 IU/L (ref 0–44)
AST: 22 IU/L (ref 0–40)
Albumin: 4.8 g/dL (ref 3.8–4.8)
Alkaline Phosphatase: 75 IU/L (ref 44–121)
BUN/Creatinine Ratio: 14 (ref 10–24)
BUN: 19 mg/dL (ref 8–27)
Bilirubin Total: 0.7 mg/dL (ref 0.0–1.2)
CO2: 23 mmol/L (ref 20–29)
Calcium: 9.6 mg/dL (ref 8.6–10.2)
Chloride: 102 mmol/L (ref 96–106)
Creatinine, Ser: 1.4 mg/dL — ABNORMAL HIGH (ref 0.76–1.27)
Globulin, Total: 2.8 g/dL (ref 1.5–4.5)
Glucose: 81 mg/dL (ref 70–99)
Potassium: 4.6 mmol/L (ref 3.5–5.2)
Sodium: 141 mmol/L (ref 134–144)
Total Protein: 7.6 g/dL (ref 6.0–8.5)
eGFR: 52 mL/min/{1.73_m2} — ABNORMAL LOW (ref >59)

## 2023-05-08 ENCOUNTER — Other Ambulatory Visit: Payer: Self-pay | Admitting: Orthopedic Surgery

## 2023-05-12 NOTE — Progress Notes (Unsigned)
[facility] [place]  ASSESSMENT / PLAN: 77 y.o. male with history of EVAR. 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 This is not causing endoleak.   CHIEF COMPLAINT: ***  HISTORY OF PRESENT ILLNESS: Connor Holmes is a 77 y.o. male ***  VASCULAR SURGICAL HISTORY: ***  VASCULAR RISK FACTORS: {FINDINGS; POSITIVE NEGATIVE:352 330 7788} history of stroke / transient ischemic attack. {FINDINGS; POSITIVE NEGATIVE:352 330 7788} history of coronary artery disease. *** history of PCI. *** history of CABG.  {FINDINGS; POSITIVE NEGATIVE:352 330 7788} history of diabetes mellitus. Last A1c ***. {FINDINGS; POSITIVE NEGATIVE:352 330 7788} history of smoking. *** actively smoking. {FINDINGS; POSITIVE NEGATIVE:352 330 7788} history of hypertension. *** drug regimen with *** control. {FINDINGS; POSITIVE NEGATIVE:352 330 7788} history of chronic kidney disease.  Last GFR ***. CKD {stage:30421363}. {FINDINGS; POSITIVE NEGATIVE:352 330 7788} history of chronic obstructive pulmonary disease, treated with ***.  FUNCTIONAL STATUS: ECOG performance status: {findings; ecog performance status:31780} Ambulatory status: {TNHAmbulation:25868}  CAREY 1 AND 3 YEAR INDEX Male (2pts) 75-79 or 80-84 (2pts) >84 (3pts) Dependence in toileting (1pt) Partial or full dependence in dressing (1pt) History of malignant neoplasm (2pts) CHF (3pts) COPD (1pts) CKD (3pts)  0-3 pts 6% 1 year mortality ; 21% 3 year mortality 4-5 pts 12% 1 year mortality ; 36% 3 year mortality >5 pts 21% 1 year mortality; 54% 3 year mortality   Past Medical History:  Diagnosis Date   Arthritis    Hypertension     Past Surgical History:  Procedure Laterality Date   ABDOMINAL AORTA STENT     AMPUTATION FINGER     LEFT HEART CATH AND CORONARY ANGIOGRAPHY Left 04/16/2023   Procedure: LEFT HEART CATH AND CORONARY ANGIOGRAPHY;  Surgeon: Alwyn Pea, MD;  Location: [facility] INVASIVE CV  LAB;  Service: Cardiovascular;  Laterality: Left;   SHOULDER SURGERY Left     Family History  Problem Relation Age of Onset   Alzheimer's disease Mother    Stroke Father     Social History   Socioeconomic History   Marital status: Married    Spouse name: Ana (LuLu)   Number of children: Not on file   Years of education: Not on file   Highest education level: Not on file  Occupational History   Not on file  Tobacco Use   Smoking status: Every Day    Current packs/day: 1.00    Average packs/day: 1 pack/day for 60.0 years (60.0 ttl pk-yrs)    Types: Cigarettes   Smokeless tobacco: Never   Tobacco comments:    0.5PPD 02/21/2023  Vaping Use   Vaping status: Never Used  Substance and Sexual Activity   Alcohol use: Yes    Alcohol/week: 1.0 standard drink of alcohol    Types: 1 Shots of liquor per week   Drug use: Never   Sexual activity: Not Currently  Other Topics Concern   Not on file  Social History Narrative   Not on file   Social Determinants of Health   Financial Resource Strain: Low Risk  (03/25/2023)   Overall Financial Resource Strain (CARDIA)    Difficulty of Paying Living Expenses: Not hard at all  Food Insecurity: No Food Insecurity (03/25/2023)   Hunger Vital Sign    Worried About Running Out of Food in the Last Year: Never true    Ran Out of Food in the Last Year: Never true  Transportation Needs: No Transportation Needs (03/25/2023)   PRAPARE - Transportation    Lack of Transportation (Medical): No    Lack  of Transportation (Non-Medical): No  Physical Activity: Sufficiently Active (03/25/2023)   Exercise Vital Sign    Days of Exercise per Week: 3 days    Minutes of Exercise per Session: 60 min  Stress: No Stress Concern Present (03/25/2023)   [employer] of Occupational Health - Occupational Stress Questionnaire    Feeling of Stress : Not at all  Social Connections: Moderately Integrated (03/25/2023)   Social Connection and Isolation Panel  [NHANES]    Frequency of Communication with Friends and Family: More than three times a week    Frequency of Social Gatherings with Friends and Family: Once a week    Attends Religious Services: More than 4 times per year    Active Member of [employer] or Organizations: No    Attends Banker Meetings: Never    Marital Status: Married  Catering manager Violence: Not At Risk (03/25/2023)   Humiliation, Afraid, Rape, and Kick questionnaire    Fear of Current or Ex-Partner: No    Emotionally Abused: No    Physically Abused: No    Sexually Abused: No    Allergies  Allergen Reactions   Other Nausea And Vomiting    Patient states he is allergic to all opioids, he gets very sick.    Current Outpatient Medications  Medication Sig Dispense Refill   atorvastatin (LIPITOR) 40 MG tablet Take 1 tablet (40 mg total) by mouth daily. 90 tablet 1   fluticasone (FLONASE) 50 MCG/ACT nasal spray Place 2 sprays into both nostrils daily. 16 g 12   Fluticasone-Umeclidin-Vilant (TRELEGY ELLIPTA) 100-62.5-25 MCG/ACT AEPB Inhale 1 puff into the lungs daily. (Patient not taking: Reported on 05/06/2023) 1 each 11   hydrochlorothiazide (HYDRODIURIL) 25 MG tablet Take 1 tablet (25 mg total) by mouth daily as needed (swelling).     levocetirizine (XYZAL) 5 MG tablet Take 1 tablet (5 mg total) by mouth every evening. 90 tablet 1   losartan (COZAAR) 25 MG tablet Take 1 tablet (25 mg total) by mouth daily. 90 tablet 1   metoprolol succinate (TOPROL-XL) 25 MG 24 hr tablet Take 1 tablet (25 mg total) by mouth daily. 90 tablet 3   No current facility-administered medications for this visit.    PHYSICAL EXAM There were no vitals filed for this visit.  Constitutional: *** appearing. *** distress. Appears *** nourished.  Neurologic: CN ***. *** focal findings. *** sensory loss. Psychiatric: *** Mood and affect symmetric and appropriate. Eyes: *** No icterus. No conjunctival pallor. Ears, nose, throat: ***  mucous membranes moist. Midline trachea.  Cardiac: *** rate and rhythm.  Respiratory: *** unlabored. Abdominal: *** soft, non-tender, non-distended.  Peripheral vascular: *** Extremity: *** edema. *** cyanosis. *** pallor.  Skin: *** gangrene. *** ulceration.  Lymphatic: *** Stemmer's sign. *** palpable lymphadenopathy.    PERTINENT LABORATORY AND RADIOLOGIC DATA  Most recent CBC    Latest Ref Rng & Units 05/06/2023    1:56 PM 01/22/2023    9:08 AM 05/24/2022   11:51 AM  CBC  WBC 3.4 - 10.8 x10E3/uL 9.2  8.8  9.2   Hemoglobin 13.0 - 17.7 g/dL 62.1  30.8  65.7   Hematocrit 37.5 - 51.0 % 53.8  54.4  51.0   Platelets 150 - 450 x10E3/uL 187  188  175      Most recent CMP    Latest Ref Rng & Units 05/06/2023    1:56 PM 03/11/2023    1:30 PM 01/22/2023    9:08 AM  CMP  Glucose  70 - 99 mg/dL 81  87  811   BUN 8 - 27 mg/dL 19  19  20    Creatinine 0.76 - 1.27 mg/dL 9.14  7.82  9.56   Sodium 134 - 144 mmol/L 141  141  138   Potassium 3.5 - 5.2 mmol/L 4.6  4.7  5.0   Chloride 96 - 106 mmol/L 102  104  102   CO2 20 - 29 mmol/L 23  21  21    Calcium 8.6 - 10.2 mg/dL 9.6  9.5  9.7   Total Protein 6.0 - 8.5 g/dL 7.6  7.2  7.1   Total Bilirubin 0.0 - 1.2 mg/dL 0.7  0.8  0.3   Alkaline Phos 44 - 121 IU/L 75  75  69   AST 0 - 40 IU/L 22  19  21    ALT 0 - 44 IU/L 29  27  32     Renal function Estimated Creatinine Clearance: 49.7 mL/min (A) (by C-G formula based on SCr of 1.4 mg/dL (H)).  HB A1C (BAYER DCA - WAIVED) (%)  Date Value  05/06/2023 6.3 (H)    LDL Chol Calc (NIH)  Date Value Ref Range Status  05/06/2023 91 0 - 99 mg/dL Final     Vascular Imaging: ***  Khala Tarte N. Lenell Antu, MD FACS [facility] Phone Number: [phone] 05/12/2023 10:33 AM   Total time spent on preparing this encounter including chart review, data review, collecting history, examining the patient, coordinating care for this {tnhtimebilling:26202}  Portions of  this report may have been transcribed using voice recognition software.  Every effort has been made to ensure accuracy; however, inadvertent computerized transcription errors may still be present.

## 2023-05-13 ENCOUNTER — Encounter: Payer: Self-pay | Admitting: Vascular Surgery

## 2023-05-13 ENCOUNTER — Telehealth: Payer: Self-pay | Admitting: Family Medicine

## 2023-05-13 ENCOUNTER — Ambulatory Visit: Payer: Medicare PPO | Admitting: Vascular Surgery

## 2023-05-13 VITALS — BP 147/84 | HR 67 | Temp 98.3°F | Resp 20 | Ht 69.0 in | Wt 203.0 lb

## 2023-05-13 DIAGNOSIS — Z8679 Personal history of other diseases of the circulatory system: Secondary | ICD-10-CM

## 2023-05-13 DIAGNOSIS — Z9889 Other specified postprocedural states: Secondary | ICD-10-CM | POA: Diagnosis not present

## 2023-05-13 NOTE — Telephone Encounter (Signed)
Copied from CRM [id number]. Topic: General - Other >> May 13, 2023  2:24 PM Everette C wrote: Reason for CRM: The patient has called to follow up on discussions related to ordering an inhaler from [facility] pharmacy   The patient would like to know the status of their order   Please contact further when possible

## 2023-05-15 ENCOUNTER — Encounter: Payer: Self-pay | Admitting: Orthopedic Surgery

## 2023-05-15 NOTE — Anesthesia Preprocedure Evaluation (Addendum)
Anesthesia Evaluation  Patient identified by MRN, date of birth, ID band Patient awake    Reviewed: Allergy & Precautions, H&P , NPO status , Patient's Chart, lab work & pertinent test results  Airway Mallampati: IV  TM Distance: >3 FB Neck ROM: Full    Dental no notable dental hx. (+) Lower Dentures, Upper Dentures   Pulmonary COPD, Current Smoker and Patient abstained from smoking. Presenting for follow up today, with recent PFT's being consistent with the diagnosis of moderate COPD.  Chest CT shows emphysema. He's started on Breztri and feels much better with it and his cough is resolved. He continues to smoke 10 cigarettes a day and I've encouraged him to quit smoking. He would be open to discussing cessation in the future. Patient reports longstanding history of smoking, having started at the age of 58 and continues to smoke.  At this time, he smokes 10 hand rolled cigarettes a day.  At his height, he smoked 3 packs a day for few years, and has also smoked 2 packs a day for a few years.  Mostly, he has been 1 pack a day smoker.    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  He reports seeing a general practitioner in [place] who performed breathing tests and he was told he has chronic bronchitis. He does travel to [place] where he spent 6 months (he owns a house there). Pulmonologist note. 01-27-23 Dr. Aundria Rud      Pulmonary exam normal breath sounds clear to auscultation       Cardiovascular hypertension, + CAD  Normal cardiovascular exam Rhythm:Regular Rate:Normal  04-16-23   Ost RCA to Prox RCA lesion is 100% stenosed.   Dist RCA lesion is 100% stenosed.   There is mild left ventricular systolic dysfunction.   LV end diastolic pressure is mildly  elevated.   The left ventricular ejection fraction is 45-50% by visual estimate.   In the absence of any other complications or medical issues, we expect the patient to be ready for discharge from a cath perspective on 04/16/2023.   Recommend dual antiplatelet therapy.   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%   Coronaries Left main large long minor irregularities LAD large minor irregularities Circumflex large minor irregularities RCA large 100% occluded ostially CTO, faint collaterals left to distal right  Right dominant system   Intervention deferred not indicated Recommend aggressive medical therapy Follow-up with cardiology 1 to 2 weeks Patient appears to be an acceptable surgical risk for shoulder surgery no intervention or modifications necessary    Neuro/Psych  Neuromuscular disease negative neurological ROS  negative psych ROS   GI/Hepatic negative GI ROS, Neg liver ROS,,,Denies GERD    Endo/Other  negative endocrine ROS    Renal/GU negative Renal ROS  negative genitourinary   Musculoskeletal negative musculoskeletal ROS (+) Arthritis ,    Abdominal   Peds negative pediatric ROS (+)  Hematology negative hematology ROS (+)   Anesthesia Other Findings HTN, COPD Arthritis Patient has cardiac letter of risk, see under cardiac.   Hard of hearing.   Pulmonologist  very much prefers for patient not to have interscalene block; says patient cannot tolerate interscalene block, unless 12 hours or less, and only with NIPPV, as interscalene block will  very likely affect the ipsilateral phrenic nerve, causing temporary 30% loss of lung function in a patient with known moderate emphysema as noted on radiology.   Discussed w/Dr. Allena Katz; do not plan to place interscalene block for this patient; do not have NIPPV at [facility]. Discussed possible hospital admission or 23 hour observation,  but Dr. Allena Katz feels patient will be well served at [facility], and will not need admission, so will proceed on that basis, as Dr. Allena Katz reports other patients who have been unable to receive a block, but were able to go home with oral pain meds.    Discussed preop with patient and wife, that patient's pulmonologist will not allow interscalene block unless he can be under respiratory assistance for duration of block and block can only be for 12 hours or less, and that patient will definitely be in a LOT of pain postop. Patient states he has "been hurting a long time anyway."  Patient initially did not want any opioids but then states he will accept them if needed.   Discussed w/patient; he is aware he cannot have interscalene block; Dr. Allena Katz will intraoperatively administer local anesthetic at surgical site.   Patient requests to "try opioids" for postop pain control if needed and will certainly oblige with that. Will also plan ketamine and lidocaine infusion intra-op.   Reproductive/Obstetrics negative OB ROS                             Anesthesia Physical Anesthesia Plan  ASA: 3  Anesthesia Plan: General   Post-op Pain Management:    Induction: Intravenous  PONV Risk Score and Plan:   Airway Management Planned: LMA  Additional Equipment:   Intra-op Plan:   Post-operative Plan: Extubation in OR  Informed Consent: I have reviewed the patients History and Physical, chart, labs and discussed the procedure including the risks, benefits and alternatives for the proposed anesthesia with the patient or authorized representative who has indicated his/her understanding and acceptance.     Dental Advisory Given  Plan Discussed with: Anesthesiologist, CRNA and Surgeon  Anesthesia Plan Comments: (Patient consented for risks of anesthesia including but not limited to:  - adverse reactions to medications - damage to eyes, teeth, lips or other oral mucosa - nerve damage  due to positioning  - sore throat or hoarseness - Damage to heart, brain, nerves, lungs, other parts of body or loss of life  Patient voiced understanding.)       Anesthesia Quick Evaluation

## 2023-05-16 ENCOUNTER — Other Ambulatory Visit: Payer: Self-pay

## 2023-05-16 DIAGNOSIS — Z9889 Other specified postprocedural states: Secondary | ICD-10-CM

## 2023-05-16 DIAGNOSIS — Z8679 Personal history of other diseases of the circulatory system: Secondary | ICD-10-CM

## 2023-05-19 ENCOUNTER — Telehealth: Payer: Self-pay

## 2023-05-19 NOTE — Telephone Encounter (Signed)
Received clearance form from [facility] clinic ortho. Patient last seen 02/21/2023. Appt is needed prior.  Lm for patient to schedule.

## 2023-05-20 ENCOUNTER — Ambulatory Visit (INDEPENDENT_AMBULATORY_CARE_PROVIDER_SITE_OTHER): Payer: Medicare PPO

## 2023-05-20 ENCOUNTER — Other Ambulatory Visit: Payer: Medicare PPO

## 2023-05-20 DIAGNOSIS — Z23 Encounter for immunization: Secondary | ICD-10-CM

## 2023-05-20 DIAGNOSIS — N289 Disorder of kidney and ureter, unspecified: Secondary | ICD-10-CM

## 2023-05-20 NOTE — Telephone Encounter (Signed)
Appt scheduled 05/20/2024 at 8:45.

## 2023-05-21 ENCOUNTER — Ambulatory Visit: Payer: Medicare PPO | Admitting: Student in an Organized Health Care Education/Training Program

## 2023-05-21 ENCOUNTER — Encounter: Payer: Self-pay | Admitting: Student in an Organized Health Care Education/Training Program

## 2023-05-21 VITALS — BP 140/80 | HR 67 | Temp 97.7°F | Ht 69.0 in | Wt 204.8 lb

## 2023-05-21 DIAGNOSIS — F17210 Nicotine dependence, cigarettes, uncomplicated: Secondary | ICD-10-CM | POA: Diagnosis not present

## 2023-05-21 DIAGNOSIS — J410 Simple chronic bronchitis: Secondary | ICD-10-CM | POA: Diagnosis not present

## 2023-05-21 DIAGNOSIS — F17200 Nicotine dependence, unspecified, uncomplicated: Secondary | ICD-10-CM | POA: Diagnosis not present

## 2023-05-21 LAB — BASIC METABOLIC PANEL
BUN/Creatinine Ratio: 20 (ref 10–24)
BUN: 27 mg/dL (ref 8–27)
CO2: 23 mmol/L (ref 20–29)
Calcium: 9.7 mg/dL (ref 8.6–10.2)
Chloride: 102 mmol/L (ref 96–106)
Creatinine, Ser: 1.34 mg/dL — ABNORMAL HIGH (ref 0.76–1.27)
Glucose: 106 mg/dL — ABNORMAL HIGH (ref 70–99)
Potassium: 4.7 mmol/L (ref 3.5–5.2)
Sodium: 140 mmol/L (ref 134–144)
eGFR: 55 mL/min/{1.73_m2} — ABNORMAL LOW (ref >59)

## 2023-05-21 MED ORDER — BREZTRI AEROSPHERE 160-9-4.8 MCG/ACT IN AERO
2.0000 | INHALATION_SPRAY | Freq: Two times a day (BID) | RESPIRATORY_TRACT | 12 refills | Status: AC
Start: 2023-05-21 — End: ?

## 2023-05-21 MED ORDER — NICOTINE POLACRILEX 2 MG MT LOZG
2.0000 mg | LOZENGE | OROMUCOSAL | 3 refills | Status: AC | PRN
Start: 2023-05-21 — End: 2023-08-19

## 2023-05-21 MED ORDER — NICOTINE 7 MG/24HR TD PT24
7.0000 mg | MEDICATED_PATCH | TRANSDERMAL | 0 refills | Status: AC
Start: 2023-05-21 — End: 2023-06-04

## 2023-05-21 MED ORDER — NICOTINE 14 MG/24HR TD PT24: 14.0000 mg | MEDICATED_PATCH | TRANSDERMAL | 0 refills | Status: AC

## 2023-05-21 MED ORDER — ALBUTEROL SULFATE HFA 108 (90 BASE) MCG/ACT IN AERS
2.0000 | INHALATION_SPRAY | Freq: Four times a day (QID) | RESPIRATORY_TRACT | 6 refills | Status: DC | PRN
Start: 2023-05-21 — End: 2023-06-06

## 2023-05-21 MED ORDER — NICOTINE 21 MG/24HR TD PT24
21.0000 mg | MEDICATED_PATCH | TRANSDERMAL | 0 refills | Status: AC
Start: 2023-05-21 — End: 2023-07-02

## 2023-05-21 NOTE — Progress Notes (Signed)
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  Chest CT also shows emphysema. He's not used the [place] since his refill ran out but reports symptoms being stable and without change. I will re-send a refill for [facility] today, and also send a PRN albuterol prescription.  - Budeson-Glycopyrrol-Formoterol (BREZTRI AEROSPHERE) 160-9-4.8 MCG/ACT AERO; Inhale 2 puffs into the lungs in the morning and at bedtime.  Dispense: 1 each; Refill: 12 - albuterol (VENTOLIN HFA) 108 (90 Base) MCG/ACT inhaler; Inhale 2 puffs into the lungs every 6 (six) hours as needed for wheezing or shortness of breath.  Dispense: 8 g; Refill: 6  #Tobacco use disorder  Again counseled Mr. Hon on smoking cessation. He will consider cessation and inquired about cessation aids. I will send a prescription for nicotine lozenges and patches.  - nicotine (NICODERM CQ - DOSED IN MG/24 HOURS) 14 mg/24hr patch; Place 1 patch (14 mg total) onto the skin daily for 14 days.  Dispense: 14 patch; Refill: 0 - nicotine (NICODERM CQ - DOSED IN MG/24 HOURS) 21 mg/24hr patch; Place 1 patch (21 mg total) onto the skin daily.  Dispense: 42 patch; Refill: 0 - nicotine (NICODERM CQ - DOSED IN MG/24 HR) 7 mg/24hr patch; Place 1 patch (7 mg total) onto the skin daily for 14 days.  Dispense: 14 patch; Refill: 0 - nicotine polacrilex (NICOTINE MINI) 2 MG lozenge; Take 1 lozenge (2 mg total) by mouth every 2 (two) hours as needed for smoking cessation.  Dispense: 72 lozenge; Refill: 3  #Pre-operative risk assessment  Patient is scheduled for shoulder surgery, and is low risk for said procedure. He is optimized from a pulmonary perspective. He scores 12 points on the Arozullah Index (class 2, 1.8% risk of post operative respiratory failure). He also scores low risk on the  Ariscat risk index for pulmonary complication.   Return in about 1 year (around 05/20/2024).  I spent 30 minutes caring for this patient today, including preparing to see the patient, obtaining a medical history , reviewing a separately obtained history, performing a medically appropriate examination and/or evaluation, counseling and educating the patient/family/caregiver, ordering medications, tests, or procedures, and documenting clinical information in the electronic health record. 3 minutes spent on smoking cessation counseling.  Raechel Chute, MD [facility] 05/21/2023 9:00 AM    End of visit medications:  Meds ordered this encounter  Medications   Budeson-Glycopyrrol-Formoterol (BREZTRI AEROSPHERE) 160-9-4.8 MCG/ACT AERO    Sig: Inhale 2 puffs into the lungs in the morning and at bedtime.    Dispense:  1 each    Refill:  12   albuterol (VENTOLIN HFA) 108 (90 Base) MCG/ACT inhaler    Sig: Inhale 2 puffs into the lungs every 6 (six) hours as needed for wheezing or shortness of breath.    Dispense:  8 g    Refill:  6   nicotine (NICODERM CQ - DOSED IN MG/24 HOURS) 14 mg/24hr patch    Sig: Place 1 patch (14 mg total) onto the skin daily for 14 days.    Dispense:  14 patch    Refill:  0   nicotine (NICODERM CQ - DOSED IN MG/24 HOURS) 21 mg/24hr patch    Sig: Place 1 patch (21 mg total) onto the skin daily.    Dispense:  42 patch  Refill:  0   nicotine (NICODERM CQ - DOSED IN MG/24 HR) 7 mg/24hr patch    Sig: Place 1 patch (7 mg total) onto the skin daily for 14 days.    Dispense:  14 patch    Refill:  0   nicotine polacrilex (NICOTINE MINI) 2 MG lozenge    Sig: Take 1 lozenge (2 mg total) by mouth every 2 (two) hours as needed for smoking cessation.    Dispense:  72 lozenge    Refill:  3     Current Outpatient Medications:    albuterol (VENTOLIN HFA) 108 (90 Base) MCG/ACT inhaler, Inhale 2 puffs into the lungs every 6 (six) hours as needed for wheezing  or shortness of breath., Disp: 8 g, Rfl: 6   atorvastatin (LIPITOR) 40 MG tablet, Take 1 tablet (40 mg total) by mouth daily., Disp: 90 tablet, Rfl: 1   Budeson-Glycopyrrol-Formoterol (BREZTRI AEROSPHERE) 160-9-4.8 MCG/ACT AERO, Inhale 2 puffs into the lungs in the morning and at bedtime., Disp: 1 each, Rfl: 12   fluticasone (FLONASE) 50 MCG/ACT nasal spray, Place 2 sprays into both nostrils daily., Disp: 16 g, Rfl: 12   hydrochlorothiazide (HYDRODIURIL) 25 MG tablet, Take 1 tablet (25 mg total) by mouth daily as needed (swelling)., Disp: , Rfl:    levocetirizine (XYZAL) 5 MG tablet, Take 1 tablet (5 mg total) by mouth every evening., Disp: 90 tablet, Rfl: 1   losartan (COZAAR) 25 MG tablet, Take 1 tablet (25 mg total) by mouth daily., Disp: 90 tablet, Rfl: 1   metoprolol succinate (TOPROL-XL) 25 MG 24 hr tablet, Take 1 tablet (25 mg total) by mouth daily., Disp: 90 tablet, Rfl: 3   nicotine (NICODERM CQ - DOSED IN MG/24 HOURS) 14 mg/24hr patch, Place 1 patch (14 mg total) onto the skin daily for 14 days., Disp: 14 patch, Rfl: 0   nicotine (NICODERM CQ - DOSED IN MG/24 HOURS) 21 mg/24hr patch, Place 1 patch (21 mg total) onto the skin daily., Disp: 42 patch, Rfl: 0   nicotine (NICODERM CQ - DOSED IN MG/24 HR) 7 mg/24hr patch, Place 1 patch (7 mg total) onto the skin daily for 14 days., Disp: 14 patch, Rfl: 0   nicotine polacrilex (NICOTINE MINI) 2 MG lozenge, Take 1 lozenge (2 mg total) by mouth every 2 (two) hours as needed for smoking cessation., Disp: 72 lozenge, Rfl: 3   Subjective:   PATIENT ID: Connor Holmes GENDER: male DOB: 02-Mar-1946, MRN: [id number]  Chief Complaint  Patient presents with   Follow-up    No current sx. Upcoming surgery 09/27    HPI  Patient is a pleasant 77 year old male presenting to clinic for follow up.  He hasn't used the [employer] for a while because he did not receive any refills. He feels his symptoms are stable, however, without change. No increase in cough,  shortness of breath, tightness, or wheeze. He remains very active and has no complaints. He is scheduled for shoulder surgery in a few days and is presenting for pre-operative risk assessment.  Since our last visit, he has also been seen by cardiology. 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   Initial visited noted that symptoms started 3 years ago and mostly consisted of a chronic cough productive of whitish sputum. He denied shortness of breath. Patient had spirometry at his primary care physician's office with findings consistent with obstruction. He  was prescribed Trelegy but did not tolerate it so was switched to [facility] which he did not use. He denied any personal history of asthma or lung disease growing up, does not have any family history of lung disease, and denies any symptoms of autoimmune disease.    Patient reports longstanding history of smoking, having started at the age of 74 and continues to smoke.  At this time, he smokes 10 hand rolled cigarettes a day.  At his height, he smoked 3 packs a day for few years, and has also smoked 2 packs a day for a few years.  Mostly, he has been 1 pack a day smoker.    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  He reports seeing a general practitioner in [place] who performed breathing tests and he was told he has chronic bronchitis. He does travel to [place] where he spends 6 months a year (he owns a house there).  Ancillary information including prior medications, full medical/surgical/family/social histories, and PFTs (when available) are listed below and have been reviewed.   Review of Systems  Constitutional:  Negative for chills, fever,  malaise/fatigue and weight loss.  Respiratory:  Negative for cough, hemoptysis, sputum production, shortness of breath and wheezing.   Cardiovascular:  Negative for chest pain.     Objective:   Vitals:   05/21/23 0845  BP: (!) 140/80  Pulse: 67  Temp: 97.7 F (36.5 C)  TempSrc: Oral  SpO2: 96%  Weight: 204 lb 12.8 oz (92.9 kg)  Height: 5\' 9"  (1.753 m)   96% on RA BMI Readings from Last 3 Encounters:  05/21/23 30.24 kg/m  05/13/23 29.98 kg/m  05/06/23 30.18 kg/m   Wt Readings from Last 3 Encounters:  05/21/23 204 lb 12.8 oz (92.9 kg)  05/13/23 203 lb (92.1 kg)  05/06/23 204 lb 6.4 oz (92.7 kg)    Physical Exam Constitutional:      Appearance: Normal appearance. He is not ill-appearing.  Cardiovascular:     Rate and Rhythm: Normal rate and regular rhythm.     Pulses: Normal pulses.     Heart sounds: Normal heart sounds.  Pulmonary:     Effort: Pulmonary effort is normal.     Breath sounds: Normal breath sounds.  Abdominal:     Palpations: Abdomen is soft.  Musculoskeletal:     Right lower leg: No edema.     Left lower leg: No edema.  Neurological:     General: No focal deficit present.     Mental Status: He is alert and oriented to person, place, and time. Mental status is at baseline.       Ancillary Information    Past Medical History:  Diagnosis Date   Arthritis    Emphysema/COPD (HCC)    Hypertension      Family History  Problem Relation Age of Onset   Alzheimer's disease Mother    Stroke Father      Past Surgical History:  Procedure Laterality Date   ABDOMINAL AORTA STENT     AMPUTATION FINGER     LEFT HEART CATH AND CORONARY ANGIOGRAPHY Left 04/16/2023   Procedure: LEFT HEART CATH AND CORONARY ANGIOGRAPHY;  Surgeon: Alwyn Pea, MD;  Location: [facility] INVASIVE CV LAB;  Service: Cardiovascular;  Laterality: Left;   SHOULDER SURGERY Left  Social History   Socioeconomic History   Marital status: Married    Spouse name: Ana  (LuLu)   Number of children: Not on file   Years of education: Not on file   Highest education level: Not on file  Occupational History   Not on file  Tobacco Use   Smoking status: Every Day    Current packs/day: 0.50    Average packs/day: 1 pack/day for 60.7 years (60.4 ttl pk-yrs)    Types: Cigarettes    Start date: 2024   Smokeless tobacco: Never   Tobacco comments:    0.5PPD 05/21/2023  Vaping Use   Vaping status: Never Used  Substance and Sexual Activity   Alcohol use: Yes    Alcohol/week: 1.0 standard drink of alcohol    Types: 1 Shots of liquor per week   Drug use: Never   Sexual activity: Not Currently  Other Topics Concern   Not on file  Social History Narrative   Not on file   Social Determinants of Health   Financial Resource Strain: Low Risk  (03/25/2023)   Overall Financial Resource Strain (CARDIA)    Difficulty of Paying Living Expenses: Not hard at all  Food Insecurity: No Food Insecurity (03/25/2023)   Hunger Vital Sign    Worried About Running Out of Food in the Last Year: Never true    Ran Out of Food in the Last Year: Never true  Transportation Needs: No Transportation Needs (03/25/2023)   PRAPARE - Administrator, Civil Service (Medical): No    Lack of Transportation (Non-Medical): No  Physical Activity: Sufficiently Active (03/25/2023)   Exercise Vital Sign    Days of Exercise per Week: 3 days    Minutes of Exercise per Session: 60 min  Stress: No Stress Concern Present (03/25/2023)   [employer] of Occupational Health - Occupational Stress Questionnaire    Feeling of Stress : Not at all  Social Connections: Moderately Integrated (03/25/2023)   Social Connection and Isolation Panel [NHANES]    Frequency of Communication with Friends and Family: More than three times a week    Frequency of Social Gatherings with Friends and Family: Once a week    Attends Religious Services: More than 4 times per year    Active Member of [employer] or  Organizations: No    Attends Banker Meetings: Never    Marital Status: Married  Catering manager Violence: Not At Risk (03/25/2023)   Humiliation, Afraid, Rape, and Kick questionnaire    Fear of Current or Ex-Partner: No    Emotionally Abused: No    Physically Abused: No    Sexually Abused: No     Allergies  Allergen Reactions   Other Nausea And Vomiting    Patient states he is allergic to all opioids, he gets very sick.     CBC    Component Value Date/Time   WBC 9.2 05/06/2023 1356   RBC 5.64 05/06/2023 1356   RBC 5.42 (A) 01/09/2021 0000   HGB 18.0 (H) 05/06/2023 1356   HCT 53.8 (H) 05/06/2023 1356   PLT 187 05/06/2023 1356   MCV 95 05/06/2023 1356   MCH 31.9 05/06/2023 1356   MCHC 33.5 05/06/2023 1356   RDW 12.8 05/06/2023 1356   LYMPHSABS 2.5 05/06/2023 1356   EOSABS 0.3 05/06/2023 1356   BASOSABS 0.1 05/06/2023 1356    Pulmonary Functions Testing Results:    Latest Ref Rng & Units 01/30/2023    3:27 PM  PFT Results  FVC-Pre L 3.79   FVC-Predicted Pre % 97   FVC-Post L 3.62   FVC-Predicted Post % 92   Pre FEV1/FVC % % 49   Post FEV1/FCV % % 44   FEV1-Pre L 1.84   FEV1-Predicted Pre % 65   FEV1-Post L 1.59   DLCO uncorrected ml/min/mmHg 11.48   DLCO UNC% % 48   DLVA Predicted % 45   TLC L 5.88   TLC % Predicted % 87   RV % Predicted % 98     Outpatient Medications Prior to Visit  Medication Sig Dispense Refill   atorvastatin (LIPITOR) 40 MG tablet Take 1 tablet (40 mg total) by mouth daily. 90 tablet 1   fluticasone (FLONASE) 50 MCG/ACT nasal spray Place 2 sprays into both nostrils daily. 16 g 12   hydrochlorothiazide (HYDRODIURIL) 25 MG tablet Take 1 tablet (25 mg total) by mouth daily as needed (swelling).     levocetirizine (XYZAL) 5 MG tablet Take 1 tablet (5 mg total) by mouth every evening. 90 tablet 1   losartan (COZAAR) 25 MG tablet Take 1 tablet (25 mg total) by mouth daily. 90 tablet 1   metoprolol succinate (TOPROL-XL) 25 MG 24  hr tablet Take 1 tablet (25 mg total) by mouth daily. 90 tablet 3   Fluticasone-Umeclidin-Vilant (TRELEGY ELLIPTA) 100-62.5-25 MCG/ACT AEPB Inhale 1 puff into the lungs daily. (Patient not taking: Reported on 05/21/2023) 1 each 11   No facility-administered medications prior to visit.

## 2023-05-21 NOTE — Patient Instructions (Signed)
The [facility] Quitline: Call 1-800-QUIT-NOW (929-526-2324). The [facility] Quitline is a free service for [employer]. Trained counselors are available from 8 am until 3 am, 365 days per year. Services are available in both [place] and [place].   Web Resources Free online support programs can help you track your progress and share experiences with others who are quitting. These are examples: www.becomeanex.org www.trytostop.org  www.smokefree.gov  www.https://www.vargas.com/.aspx  [facility]: offers comprehensive in-person tobacco treatment counseling at [facility] building ([address] [place] [address]).  Open to everyone. Virtual appointments available. Free parking. Call [phone] to schedule an appointment or [phone] for general information.    Tobacco Cessation Medications  Nicotine Replacement Therapy (NRT)  Nicotine is the addictive part of tobacco smoke, but not the most dangerous part. There are 7000 other toxins in cigarettes, including carbon monoxide, that cause disease. People do not generally become addicted to medication. Common problems: People don't use enough medication or stop too early. Medications are safe and effective. Overdose is very uncommon. Use medications as long as needed (3 months minimum). Some combinations work better than single medications. Long acting medications like the NRT patch and bupropion provide continuous treatment for withdrawal symptoms.  PLUS  Short acting medications like the NRT gum, lozenge, inhaler, and nasal spray help people to cope with breakthrough cravings.  ? Nicotine Patch  Place patch on hairless skin on upper body, including arms and back. Each day: discard old patch, shower, apply new patch to a different site. Apply hydrocortisone cream to mildly red/irritated areas. Call provider if rash develops. If patch causes sleep disturbance, remove patch  at bedtime and replace each morning after shower. Side effects may include: skin irritation, headache, insomnia, abnormal/vivid dreams.   ? Nicotine Lozenge  Allow to dissolve slowly in mouth (20-30 minutes). Do not chew or swallow. Nicotine release may cause a warm tingling sensation. Occasionally rotate to different areas of the mouth. Use enough to control cravings, up to 20 lozenges per day (if used alone). Avoid eating or drinking for 15 minutes before using and during use. Side effects may include: nausea, hiccups, cough, heartburn, headache, gas, insomnia.

## 2023-05-23 ENCOUNTER — Ambulatory Visit: Payer: Medicare PPO | Admitting: Anesthesiology

## 2023-05-23 ENCOUNTER — Other Ambulatory Visit: Payer: Self-pay

## 2023-05-23 ENCOUNTER — Encounter
Admission: RE | Disposition: A | Discharge status: Home / Self Care | Payer: Self-pay | Source: Home / Self Care | Attending: Orthopedic Surgery

## 2023-05-23 ENCOUNTER — Ambulatory Visit
Admission: RE | Admit: 2023-05-23 | Discharge: 2023-05-23 | Disposition: A | Discharge status: Home / Self Care | Payer: Medicare PPO | Attending: Orthopedic Surgery | Admitting: Orthopedic Surgery

## 2023-05-23 ENCOUNTER — Encounter: Payer: Self-pay | Admitting: Orthopedic Surgery

## 2023-05-23 DIAGNOSIS — M25811 Other specified joint disorders, right shoulder: Secondary | ICD-10-CM | POA: Diagnosis not present

## 2023-05-23 DIAGNOSIS — M19011 Primary osteoarthritis, right shoulder: Secondary | ICD-10-CM | POA: Insufficient documentation

## 2023-05-23 DIAGNOSIS — I2510 Atherosclerotic heart disease of native coronary artery without angina pectoris: Secondary | ICD-10-CM | POA: Insufficient documentation

## 2023-05-23 DIAGNOSIS — F17210 Nicotine dependence, cigarettes, uncomplicated: Secondary | ICD-10-CM | POA: Insufficient documentation

## 2023-05-23 DIAGNOSIS — J449 Chronic obstructive pulmonary disease, unspecified: Secondary | ICD-10-CM | POA: Insufficient documentation

## 2023-05-23 DIAGNOSIS — I10 Essential (primary) hypertension: Secondary | ICD-10-CM | POA: Insufficient documentation

## 2023-05-23 DIAGNOSIS — M75101 Unspecified rotator cuff tear or rupture of right shoulder, not specified as traumatic: Secondary | ICD-10-CM | POA: Diagnosis not present

## 2023-05-23 DIAGNOSIS — Z7951 Long term (current) use of inhaled steroids: Secondary | ICD-10-CM | POA: Diagnosis not present

## 2023-05-23 HISTORY — DX: Emphysema, unspecified: J43.9

## 2023-05-23 SURGERY — SHOULDER ARTHROSCOPY WITH SUBACROMIAL DECOMPRESSION AND DISTAL CLAVICLE EXCISION
Anesthesia: General | Site: Shoulder | Laterality: Right

## 2023-05-23 MED ORDER — LIDOCAINE HCL (PF) 2 % IJ SOLN
INTRAMUSCULAR | Status: AC
  Filled 2023-05-23: qty 5

## 2023-05-23 MED ORDER — CEFAZOLIN SODIUM-DEXTROSE 2-4 GM/100ML-% IV SOLN
2.0000 g | INTRAVENOUS | Status: AC
  Administered 2023-05-23: 2 g via INTRAVENOUS

## 2023-05-23 MED ORDER — ONDANSETRON HCL 4 MG/2ML IJ SOLN
INTRAMUSCULAR | Status: DC | PRN
Start: 2023-05-23 — End: 2023-05-23
  Administered 2023-05-23: 4 mg via INTRAVENOUS

## 2023-05-23 MED ORDER — DEXMEDETOMIDINE HCL IN NACL 200 MCG/50ML IV SOLN
INTRAVENOUS | Status: DC | PRN
  Administered 2023-05-23 (×5): 4 ug via INTRAVENOUS

## 2023-05-23 MED ORDER — OXYCODONE HCL 5 MG PO TABS
10.0000 mg | ORAL_TABLET | Freq: Once | ORAL | Status: AC
  Administered 2023-05-23: 10 mg via ORAL

## 2023-05-23 MED ORDER — DEXAMETHASONE SODIUM PHOSPHATE 4 MG/ML IJ SOLN
INTRAMUSCULAR | Status: DC | PRN
  Administered 2023-05-23: 4 mg via INTRAVENOUS

## 2023-05-23 MED ORDER — CEFAZOLIN SODIUM-DEXTROSE 2-3 GM-%(50ML) IV SOLR
INTRAVENOUS | Status: AC
  Filled 2023-05-23: qty 50

## 2023-05-23 MED ORDER — KETAMINE HCL 100 MG/ML IJ SOLN
INTRAMUSCULAR | Status: DC | PRN
  Administered 2023-05-23 (×2): 50 mg via INTRAMUSCULAR

## 2023-05-23 MED ORDER — PROPOFOL 10 MG/ML IV BOLUS
INTRAVENOUS | Status: AC
  Filled 2023-05-23: qty 20

## 2023-05-23 MED ORDER — ACETAMINOPHEN 10 MG/ML IV SOLN
INTRAVENOUS | Status: AC
  Filled 2023-05-23: qty 100

## 2023-05-23 MED ORDER — MIDAZOLAM HCL 2 MG/2ML IJ SOLN
INTRAMUSCULAR | Status: AC
  Filled 2023-05-23: qty 2

## 2023-05-23 MED ORDER — HYDROMORPHONE HCL 1 MG/ML IJ SOLN
INTRAMUSCULAR | Status: AC
  Filled 2023-05-23: qty 0.5

## 2023-05-23 MED ORDER — MIDAZOLAM HCL 5 MG/5ML IJ SOLN
INTRAMUSCULAR | Status: DC | PRN
  Administered 2023-05-23: 2 mg via INTRAVENOUS

## 2023-05-23 MED ORDER — EPHEDRINE SULFATE (PRESSORS) 50 MG/ML IJ SOLN
INTRAMUSCULAR | Status: DC | PRN
Start: 2023-05-23 — End: 2023-05-23
  Administered 2023-05-23: 10 mg via INTRAVENOUS
  Administered 2023-05-23 (×4): 5 mg via INTRAVENOUS

## 2023-05-23 MED ORDER — NALOXONE HCL 0.4 MG/ML IJ SOLN
0.4000 mg | INTRAMUSCULAR | Status: DC | PRN
  Administered 2023-05-23: 0.4 mg via INTRAVENOUS

## 2023-05-23 MED ORDER — HYDROMORPHONE HCL 1 MG/ML IJ SOLN
INTRAMUSCULAR | Status: AC
  Filled 2023-05-23: qty 1

## 2023-05-23 MED ORDER — BUPIVACAINE LIPOSOME 1.3 % IJ SUSP
INTRAMUSCULAR | Status: AC
  Filled 2023-05-23: qty 20

## 2023-05-23 MED ORDER — NALOXONE HCL 0.4 MG/ML IJ SOLN
INTRAMUSCULAR | Status: AC
  Filled 2023-05-23: qty 1

## 2023-05-23 MED ORDER — KETAMINE HCL 100 MG/ML IJ SOLN
INTRAMUSCULAR | Status: AC
  Filled 2023-05-23: qty 1

## 2023-05-23 MED ORDER — FENTANYL CITRATE (PF) 100 MCG/2ML IJ SOLN
INTRAMUSCULAR | Status: DC | PRN
  Administered 2023-05-23: 100 ug via INTRAVENOUS

## 2023-05-23 MED ORDER — EPHEDRINE 5 MG/ML INJ
INTRAVENOUS | Status: AC
  Filled 2023-05-23: qty 5

## 2023-05-23 MED ORDER — LACTATED RINGERS IV SOLN: INTRAVENOUS | Status: DC

## 2023-05-23 MED ORDER — ONDANSETRON HCL 4 MG/2ML IJ SOLN
INTRAMUSCULAR | Status: AC
  Filled 2023-05-23: qty 2

## 2023-05-23 MED ORDER — OXYCODONE HCL 5 MG PO TABS
ORAL_TABLET | ORAL | Status: AC
  Filled 2023-05-23: qty 2

## 2023-05-23 MED ORDER — TRAMADOL HCL 50 MG PO TABS: 50.0000 mg | ORAL_TABLET | ORAL | 0 refills | Status: DC | PRN

## 2023-05-23 MED ORDER — DEXAMETHASONE SODIUM PHOSPHATE 4 MG/ML IJ SOLN
INTRAMUSCULAR | Status: AC
  Filled 2023-05-23: qty 1

## 2023-05-23 MED ORDER — LACTATED RINGERS IR SOLN
Status: DC | PRN
  Administered 2023-05-23: 12000 mL

## 2023-05-23 MED ORDER — ONDANSETRON 4 MG PO TBDP: 4.0000 mg | ORAL_TABLET | Freq: Three times a day (TID) | ORAL | 0 refills | Status: AC | PRN

## 2023-05-23 MED ORDER — SEVOFLURANE IN SOLN
RESPIRATORY_TRACT | Status: AC
  Filled 2023-05-23: qty 250

## 2023-05-23 MED ORDER — HYDROMORPHONE HCL 1 MG/ML IJ SOLN
0.5000 mg | Freq: Once | INTRAMUSCULAR | Status: AC
  Administered 2023-05-23: 0.5 mg via INTRAVENOUS

## 2023-05-23 MED ORDER — AMISULPRIDE (ANTIEMETIC) 5 MG/2ML IV SOLN: 10.0000 mg | Freq: Once | INTRAVENOUS | Status: DC

## 2023-05-23 MED ORDER — TETRACAINE HCL 0.5 % OP SOLN
1.0000 [drp] | OPHTHALMIC | 0 refills | Status: AC | PRN
Start: 2023-05-23 — End: ?

## 2023-05-23 MED ORDER — ASPIRIN 325 MG PO TBEC: 325.0000 mg | DELAYED_RELEASE_TABLET | Freq: Every day | ORAL | 0 refills | Status: AC

## 2023-05-23 MED ORDER — ACETAMINOPHEN 500 MG PO TABS: 1000.0000 mg | ORAL_TABLET | Freq: Three times a day (TID) | ORAL | 2 refills | Status: AC

## 2023-05-23 MED ORDER — HYDROMORPHONE HCL 1 MG/ML IJ SOLN
0.5000 mg | INTRAMUSCULAR | Status: AC | PRN
  Administered 2023-05-23 (×4): 0.5 mg via INTRAVENOUS

## 2023-05-23 MED ORDER — LACTATED RINGERS IV SOLN
INTRAVENOUS | Status: DC | PRN
  Administered 2023-05-23: 4 mL

## 2023-05-23 MED ORDER — LIDOCAINE HCL (CARDIAC) PF 100 MG/5ML IV SOSY
PREFILLED_SYRINGE | INTRAVENOUS | Status: DC | PRN
  Administered 2023-05-23: 200 mg via INTRATRACHEAL

## 2023-05-23 MED ORDER — TETRACAINE HCL 0.5 % OP SOLN
OPHTHALMIC | Status: AC
  Filled 2023-05-23: qty 4

## 2023-05-23 MED ORDER — OXYCODONE HCL 5 MG PO TABS: 5.0000 mg | ORAL_TABLET | ORAL | 0 refills | Status: DC | PRN

## 2023-05-23 MED ORDER — BUPIVACAINE LIPOSOME 1.3 % IJ SUSP
INTRAMUSCULAR | Status: DC | PRN
Start: 2023-05-23 — End: 2023-05-23
  Administered 2023-05-23: 20 mL

## 2023-05-23 MED ORDER — ONDANSETRON HCL 4 MG/2ML IJ SOLN: INTRAMUSCULAR | Status: DC | PRN

## 2023-05-23 MED ORDER — AMISULPRIDE (ANTIEMETIC) 5 MG/2ML IV SOLN
INTRAVENOUS | Status: AC
  Filled 2023-05-23: qty 4

## 2023-05-23 MED ORDER — FENTANYL CITRATE (PF) 100 MCG/2ML IJ SOLN
INTRAMUSCULAR | Status: AC
  Filled 2023-05-23: qty 2

## 2023-05-23 MED ORDER — ACETAMINOPHEN 10 MG/ML IV SOLN
INTRAVENOUS | Status: DC | PRN
  Administered 2023-05-23: 1000 mg via INTRAVENOUS

## 2023-05-23 MED ORDER — NAPROXEN 500 MG PO TABS: 500.0000 mg | ORAL_TABLET | Freq: Two times a day (BID) | ORAL | 2 refills | Status: AC

## 2023-05-23 MED ORDER — PROPOFOL 10 MG/ML IV BOLUS
INTRAVENOUS | Status: DC | PRN
  Administered 2023-05-23: 150 mg via INTRAVENOUS

## 2023-05-23 SURGICAL SUPPLY — 68 items
ADH SKN CLS APL DERMABOND .7 (GAUZE/BANDAGES/DRESSINGS) ×1
ADPR IRR PORT MULTIBAG TUBE (MISCELLANEOUS) ×1
ANCH SUT 2 SWLK 19.1 CLS EYLT (Anchor) ×3 IMPLANT
ANCH SUT 2.9 PUSHLOCK ANCH (Orthopedic Implant) ×1 IMPLANT
ANCH SUT 2X2.3 TAPE (Anchor) ×2 IMPLANT
ANCHOR 2.3 SP SGL 1.2 XBRAID (Anchor) IMPLANT
ANCHOR SWIVELOCK BIO 4.75X19.1 (Anchor) IMPLANT
APL PRP STRL LF DISP 70% ISPRP (MISCELLANEOUS) ×1
BLADE SHAVER 4.5X7 STR FR (MISCELLANEOUS) ×1 IMPLANT
BUR BR 5.5 WIDE MOUTH (BURR) ×1 IMPLANT
CANNULA PART THRD DISP 5.75X7 (CANNULA) IMPLANT
CANNULA PARTIAL THREAD 2X7 (CANNULA) IMPLANT
CANNULA TWIST IN 8.25X7CM (CANNULA) IMPLANT
CHLORAPREP W/TINT 26 (MISCELLANEOUS) ×1 IMPLANT
COOLER POLAR GLACIER W/PUMP (MISCELLANEOUS) ×1 IMPLANT
COVER LIGHT HANDLE UNIVERSAL (MISCELLANEOUS) ×2 IMPLANT
DERMABOND ADVANCED .7 DNX12 (GAUZE/BANDAGES/DRESSINGS) IMPLANT
DRAPE INCISE IOBAN 66X45 STRL (DRAPES) ×1 IMPLANT
DRAPE STERI 35X30 U-POUCH (DRAPES) ×1 IMPLANT
DRAPE U-SHAPE 48X52 POLY STRL (PACKS) ×1 IMPLANT
DRSG TEGADERM 4X4.75 (GAUZE/BANDAGES/DRESSINGS) ×3 IMPLANT
ELECT REM PT RETURN 9FT ADLT (ELECTROSURGICAL)
ELECTRODE REM PT RTRN 9FT ADLT (ELECTROSURGICAL) IMPLANT
GAUZE SPONGE 4X4 12PLY STRL (GAUZE/BANDAGES/DRESSINGS) ×1 IMPLANT
GAUZE XEROFORM 1X8 LF (GAUZE/BANDAGES/DRESSINGS) ×1 IMPLANT
GLOVE SRG 8 PF TXTR STRL LF DI (GLOVE) ×3 IMPLANT
GLOVE SURG ENC MOIS LTX SZ7.5 (GLOVE) ×4 IMPLANT
GLOVE SURG UNDER POLY LF SZ8 (GLOVE) ×3
GOWN STRL REIN 2XL XLG LVL4 (GOWN DISPOSABLE) ×1 IMPLANT
GOWN STRL REUS W/ TWL LRG LVL3 (GOWN DISPOSABLE) ×3 IMPLANT
GOWN STRL REUS W/TWL LRG LVL3 (GOWN DISPOSABLE) ×3
IV LACTATED RINGER IRRG 3000ML (IV SOLUTION) ×4
IV LR IRRIG 3000ML ARTHROMATIC (IV SOLUTION) ×4 IMPLANT
KIT STABILIZATION SHOULDER (MISCELLANEOUS) ×1 IMPLANT
KIT TURNOVER KIT A (KITS) ×1 IMPLANT
MANIFOLD NEPTUNE II (INSTRUMENTS) ×1 IMPLANT
MASK FACE SPIDER DISP (MASK) ×1 IMPLANT
MAT GRAY ABSORB FLUID 28X50 (MISCELLANEOUS) ×2 IMPLANT
NDL HYPO 18GX1.5 BLUNT FILL (NEEDLE) IMPLANT
NDL MAYO CATGUT SZ4 TPR NDL (NEEDLE) IMPLANT
NEEDLE HYPO 18GX1.5 BLUNT FILL (NEEDLE) ×1
NEEDLE MAYO CATGUT SZ4 (NEEDLE) ×1
PACK ARTHROSCOPY SHOULDER (MISCELLANEOUS) ×1 IMPLANT
PAD ABD DERMACEA PRESS 5X9 (GAUZE/BANDAGES/DRESSINGS) ×2 IMPLANT
PAD WRAPON POLAR SHDR XLG (MISCELLANEOUS) ×1 IMPLANT
PASSER SUT FIRSTPASS SELF (INSTRUMENTS) IMPLANT
SET Y ADAPTER MULIT-BAG IRRIG (MISCELLANEOUS) ×1 IMPLANT
SLING ULTRA II LG (MISCELLANEOUS) IMPLANT
SPONGE T-LAP 18X18 ~~LOC~~+RFID (SPONGE) ×1 IMPLANT
SUT ETHILON 3-0 FS-10 30 BLK (SUTURE) ×1
SUT MNCRL 4-0 (SUTURE) ×1
SUT MNCRL 4-0 27XMFL (SUTURE) ×1
SUT VIC AB 0 CT1 36 (SUTURE) ×1 IMPLANT
SUT VIC AB 2-0 CT2 27 (SUTURE) IMPLANT
SUTURE EHLN 3-0 FS-10 30 BLK (SUTURE) ×1 IMPLANT
SUTURE MNCRL 4-0 27XMF (SUTURE) IMPLANT
SUTURE TAPE 1.3 40 TPR END (SUTURE) IMPLANT
SUTURETAPE 1.3 40 TPR END (SUTURE) ×1
SUTURETAPE 1.3 40 W/NDL BLK/WH (SUTURE) IMPLANT
SUTURETAPE 1.3 40 W/NDL BLUE (SUTURE) IMPLANT
SYR 20ML LL LF (SYRINGE) IMPLANT
SYSTEM IMPL TENODESIS LNT 2.9 (Orthopedic Implant) IMPLANT
TAPE MICROFOAM 4IN (TAPE) ×1 IMPLANT
TUBING CONNECTING 10 (TUBING) ×1 IMPLANT
TUBING INFLOW SET DBFLO PUMP (TUBING) ×1 IMPLANT
TUBING OUTFLOW SET DBLFO PUMP (TUBING) ×1 IMPLANT
WAND WEREWOLF FLOW 90D (MISCELLANEOUS) ×1 IMPLANT
WRAPON POLAR PAD SHDR XLG (MISCELLANEOUS) ×1

## 2023-05-23 NOTE — Op Note (Signed)
SURGERY DATE: 05/23/2023  PRE-OP DIAGNOSIS:  1. Right subacromial impingement 2. Right biceps tendinopathy 3. Right rotator cuff tear 4. Right acromioclavicular joint osteoarthritis  POST-OP DIAGNOSIS: 1. Right subacromial impingement 2. Right biceps tendinopathy 3. Right rotator cuff tear 4. Right acromioclavicular joint osteoarthritis  PROCEDURES:  1. Right arthroscopic subscapularis repair 2. Right mini-open rotator cuff repair (supraspinatus) 3. Right arthroscopic biceps tenodesis 4. Right arthroscopic distal clavicle excision 5. Right arthroscopic extensive debridement of shoulder (glenohumeral and subacromial spaces) 6. Right arthroscopic subacromial decompression  SURGEON: Rosealee Albee, MD  ANESTHESIA: Gen   ESTIMATED BLOOD LOSS: 25cc  DRAINS:  none  TOTAL IV FLUIDS: per anesthesia   SPECIMENS: none  IMPLANTS:  - Arthrex 4.20mm SwiveLock x3 - Arthrex 2.9 mm push lock - Iconix SPEED double loaded with 1.2 and 2.67mm tape x 2   OPERATIVE FINDINGS:  Examination under anesthesia: A careful examination under anesthesia was performed.  Passive range of motion was: FF: 150; ER at side: 60; ER in abduction: 90; IR in abduction: 45.  Anterior load shift: NT.  Posterior load shift: NT.  Sulcus in neutral: NT.  Sulcus in ER: NT.    Intra-operative findings: A thorough arthroscopic examination of the shoulder was performed.  The findings are: 1. Biceps tendon: tendinopathy with significant erythema and medial subluxation 2. Superior labrum: injected with surrounding synovitis 3. Posterior labrum and capsule: normal 4. Inferior capsule and inferior recess: normal 5. Glenoid cartilage surface: Grade 1 changes with mild surface fibrillation superiorly 6. Supraspinatus attachment: full-thickness tear in a V-shaped pattern 7. Posterior rotator cuff attachment: normal 8. Humeral head articular cartilage: normal 9. Rotator interval: significant synovitis 10: Subscapularis  tendon: Full-thickness tear  11. Anterior labrum: degenerative 12. IGHL: normal  OPERATIVE REPORT:   Indications for procedure: Connor Holmes is a 77 y.o. male with over 1 year of right shoulder pain that has failed non-operative management including activity modification, physical therapy, medical management and corticosteroid injection without adequate relief of symptoms. Clinical exam and MRI were suggestive of rotator cuff tear, biceps tendinopathy, subacromial impingement, and acromioclavicular joint arthritis. After discussion of risks, benefits, and alternatives to surgery, the patient elected to proceed.   Procedure in detail:  I identified Arel Tippen in the pre-operative holding area.  I marked the operative shoulder with my initials. I reviewed the risks and benefits of the proposed surgical intervention, and the patient wished to proceed.  Preoperative interscalene nerve block was not performed at the recommendation of his pulmonologist due to underlying COPD.  The patient did understand that he would likely have significant pain postoperatively.  The patient was transferred to the operative suite and placed in the beach chair position.    SCDs were placed on the lower extremities. Appropriate IV antibiotics were administered prior to incision. The operative upper extremity was then prepped and draped in standard fashion. A time out was performed confirming the correct extremity, correct patient, and correct procedure.   I then created a standard posterior portal with an 11 blade. The glenohumeral joint was easily entered with a blunt trocar and the arthroscope introduced. The findings of diagnostic arthroscopy are described above. I debrided degenerative tissue including the synovitic tissue about the rotator interval and anterior and superior labrum. I then coagulated the inflamed synovium to obtain hemostasis and reduce the risk of post-operative swelling using an Arthrocare radiofrequency  device.   I then turned my attention to the arthroscopic biceps tenodesis. The Loop n Tack technique was used to pass  a FiberTape through the biceps in a locked fashion adjacent to the biceps anchor.  A hole for a 2.9 mm Arthrex PushLock was drilled in the bicipital groove just superior to the subscapularis tendon insertion.  The biceps tendon was then cut and the biceps anchor complex was debrided down to a stable base on the superior labrum.  The FiberTape was loaded onto the PushLock anchor and impacted into place into the previously drilled hole in the bicipital groove.  This appropriately secured the biceps into the bicipital groove and took it off of tension.   The subscapularis tear was identified.  A superior anterolateral portal was made under needle localization.  A 7 mm cannula was placed.  The, comma tissue indicating the superolateral border of the subscapularis was identified readily.  The tip of the coracoid as well as the conjoined tendon and coracoacromial ligaments were visualized after debriding rotator interval tissue.  Tissue about the subscapularis was released anteriorly, superiorly, and posteriorly to allow for improved mobilization.  The comma tissue was tagged with a FiberWire suture and tension on this allowed for appropriate mobilization of the subscapularis.  The lesser tuberosity footprint was prepared with a combination of electrocautery and burr. 2 suture tapes were passed in a mattress fashion.  The traction stitch was removed.  All 4 strands of suture were then loaded onto a 4.75 mm SwiveLock anchor and placed into the prepared footprint with the arm in a neutral position.  The repair stitch from the knotless mechanism was also utilized and passed with a BirdBeak to further reduce the upper border of the subscapularis.  This construct appropriately reduced the subscapularis tear.  The arm was then internally and externally rotated and the subscapularis was noted to move  appropriately with rotation.  Next, the arthroscope was then introduced into the subacromial space. A direct lateral portal was created with an 11-blade after spinal needle localization. An extensive subacromial bursectomy and debridement was performed using a combination of the shaver and Arthrocare wand. The entire acromial undersurface was exposed and the CA ligament was subperiosteally elevated to expose the anterior acromial hook. A 5.51mm barrel burr was used to create a flat anterior and lateral aspect of the acromion, converting it from a Type 2 to a Type 1 acromion. Care was made to keep the deltoid fascia intact.  I then turned my attention to the arthroscopic distal clavicle excision. I identified the acromioclavicular joint. Surrounding bursal tissue was debrided and the edges of the joint were identified. I used the 5.36mm barrel burr to remove the distal clavicle parallel to the edge of the acromion. I was able to fit two widths of the burr into the space between the distal clavicle and acromion, signifying that I had removed ~11mm of distal clavicle. This was confirmed by viewing anteriorly and introducing a probe with measuring marks from the lateral portal. Hemostasis was achieved with an Arthrocare wand. Fluid was evacuated from the shoulder.   A longitudinal incision from the anterolateral acromion ~7cm in length was made overlying the raphe between the anterior and middle heads of the deltoid. The raphe was identified and it was incised. The subacromial space was identified. Any remaining bursa was excised. The rotator cuff tear was identified. It was a deep V-shaped tear.  The rotator cuff footprint was cleared of soft tissue. A rongeur was used to gently decorticate the rotator cuff footprint to allow for improved healing. Two Iconix SPEED anchors were placed just lateral to the articular  margin.  The rotator cuff was mobilized using key elevators both superior and inferior to the tear.   A total of 8 margin convergence sutures were placed in a side-to-side fashion starting at the apex medially and working our way laterally.  All 8 strands of suture from the medial row anchors were passed through the rotator cuff with the 4 strands of suture from the posterior medial row anchor being passed through the posterior leaflet and the 4 strands of suture from the anterior medial row anchor being passed through the anterior leaflet.  Next, while holding the rotator cuff in a reduced position using the medial row sutures, the margin convergence sutures were tied sequentially.  After completion of tying all of the margin convergence sutures, the rotator cuff was appropriate reduced. Two SwiveLock anchors were placed for the lateral row anchors with all of the posterior sutures impacted anteriorly and the anterior sutures impacted into a more posterior SwiveLock.  This construct was stable with external and internal rotation and allowed for appropriate reapproximation of the rotator cuff to its natural footprint..  The wound was thoroughly irrigated.  The deltoid split was closed with 0 Vicryl.  The subdermal layer was closed with 2-0 Vicryl.  The skin was closed with 4-0 Monocryl and Dermabond. The portals were closed with 3-0 Nylon. Xeroform was applied to the incisions. A sterile dressing was applied, followed by a Polar Care sleeve and a SlingShot shoulder immobilizer/sling. The patient was awakened from anesthesia without difficulty and was transferred to the PACU in stable condition.     COMPLICATIONS: none  DISPOSITION: plan for discharge home after recovery in PACU   POSTOPERATIVE PLAN: Remain in sling (except hygiene and elbow/wrist/hand RoM exercises as instructed by PT) x 6 weeks and NWB for this time. PT to begin 3-4 days after surgery. Large rotator cuff repair rehab protocol with subscapularis restrictions. ASA 325mg  daily x 2 weeks for DVT ppx.

## 2023-05-23 NOTE — Anesthesia Postprocedure Evaluation (Signed)
Anesthesia Post Note  Patient: Connor Holmes  Procedure(s) Performed: Right shoulder mini open rotator cuff repair, biceps tenodesis, distal clavicle excision, subacromial decompression, arthroscopic subscapularis repair. (Right: Shoulder)  Patient location during evaluation: PACU Anesthesia Type: General Level of consciousness: awake and alert Pain management: pain level controlled Vital Signs Assessment: post-procedure vital signs reviewed and stable Respiratory status: spontaneous breathing, nonlabored ventilation, respiratory function stable and patient connected to nasal cannula oxygen Cardiovascular status: blood pressure returned to baseline and stable Postop Assessment: no apparent nausea or vomiting Anesthetic complications: no Comments: Please see preop notes.  During PACU stay, patient and wife initially wanted him to be hospitalized, so initially made arrangements for that, talked with Dr. Tanya Nones hospitalist, Herbert Seta RN, and Dr. Donna Bernard in ER, and planned to have patient sent via non-emergency ambulance transport to [place], but after discussion with Dr. Juel Burrow and Dr. Allena Katz, patient and family decided to go home on oral pain meds. Patient was alert, sitting up in chair.    Note that patient may have postop left corneal abrasion. Patient reported "it feels like there is something in my eye."  I do not see anything in the affected eye. Received tetracaine 2 drops in left eye, with some relief of pain.  In discussion with patient, vision unchanged. Do have not flourescein to check for an abrasion, urged patient and wife to go to ophthalmologist or optometrist for followup to corneal abrasion. These usually resolve spontaneously in 24 to 48 hours, but exam by ophthalmologist or optometrist is suggested.  Dr. Allena Katz will write orders for eye meds til patient can be seen by ophthalmologist or optometrist.       No notable events documented.   Last Vitals:  Vitals:   05/23/23 1330  05/23/23 1409  BP:  137/72  Pulse: 78 81  Resp: 15 16  Temp:  (!) 36.4 C  SpO2: 97% 94%    Last Pain:  Vitals:   05/23/23 1409  TempSrc:   PainSc: 0-No pain                 Hanne Kegg C Sharilyn Geisinger

## 2023-05-23 NOTE — Transfer of Care (Signed)
Immediate Anesthesia Transfer of Care Note  Patient: Connor Holmes  Procedure(s) Performed: Right shoulder mini open rotator cuff repair, biceps tenodesis, distal clavicle excision, subacromial decompression, arthroscopic subscapularis repair. (Right: Shoulder)  Patient Location: PACU  Anesthesia Type: General  Level of Consciousness: awake, alert  and patient cooperative  Airway and Oxygen Therapy: Patient Spontanous Breathing and Patient connected to supplemental oxygen  Post-op Assessment: Post-op Vital signs reviewed, Patient's Cardiovascular Status Stable, Respiratory Function Stable, Patent Airway and No signs of Nausea or vomiting  Post-op Vital Signs: Reviewed and stable  Complications: No notable events documented.

## 2023-05-23 NOTE — Discharge Instructions (Addendum)
Post-Op Instructions - Rotator Cuff Repair  1. Bracing: You will wear a shoulder immobilizer or sling for 6 weeks.   2. Driving: No driving for 3 weeks post-op. When driving, do not wear the immobilizer. Ideally, we recommend no driving for 6 weeks while sling is in place as one arm will be immobilized.   3. Activity: No active lifting for 2 months. Wrist, hand, and elbow motion only. Avoid lifting the upper arm away from the body except for hygiene. You are permitted to bend and straighten the elbow passively only (no active elbow motion). You may use your hand and wrist for typing, writing, and managing utensils (cutting food). Do not lift more than a coffee cup for 8 weeks.  When sleeping or resting, inclined positions (recliner chair or wedge pillow) and a pillow under the forearm for support may provide better comfort for up to 4 weeks.  Avoid long distance travel for 4 weeks.  Return to normal activities after rotator cuff repair repair normally takes 6 months on average. If rehab goes very well, may be able to do most activities at 4 months, except overhead or contact sports.  4. Physical Therapy: Begins 3-4 days after surgery, and proceed 1 time per week for the first 6 weeks, then 1-2 times per week from weeks 6-20 post-op.  5. Medications:  - You can take 1 oxycodone (opioid medication) 5mg  tablet every 4 hours as needed for pain. Alternatively, you can take 1 tramadol (non-opioid medication) 50mg  tablet every 4 hours as needed for pain - A prescription for anti-nausea medication will be provided in case pain medication causes nausea - take 1 tablet every 6 hours only if nauseated.   Take the following 3 medications regularly until no to minimal pain: - Take tylenol 1000 mg (2 Extra Strength tablets or 3 regular strength) every 8 hours for pain.  May decrease or stop tylenol when you are having minimal pain. - Take ASA 325mg /day x 2 weeks to help prevent DVTs/PEs (blood clots).  - Take  Naproxen 500mg  twice daily with food to help with pain/inflammation  If you are taking prescription medication for anxiety, depression, insomnia, muscle spasm, chronic pain, or for attention deficit disorder, you are advised that you are at a higher risk of adverse effects with use of narcotics post-op, including narcotic addiction/dependence, depressed breathing, death. If you use non-prescribed substances: alcohol, marijuana, cocaine, heroin, methamphetamines, etc., you are at a higher risk of adverse effects with use of narcotics post-op, including narcotic addiction/dependence, depressed breathing, death. You are advised that taking > 50 morphine milligram equivalents (MME) of narcotic pain medication per day results in twice the risk of overdose or death. For your prescription provided: oxycodone 5 mg - taking more than 6 tablets per day would result in > 50 morphine milligram equivalents (MME) of narcotic pain medication. Be advised that we will prescribe narcotics short-term, for acute post-operative pain only - 3 weeks for major operations such as shoulder repair/reconstruction surgeries.   6. Post-Op Appointment:  Your first post-op appointment will be 10-14 days post-op.  7. Work or School: For most, but not all procedures, we advise staying out of work or school for at least 1 to 2 weeks in order to recover from the stress of surgery and to allow time for healing.   If you need a work or school note this can be provided.   8. Smoking: If you are a smoker, you need to refrain from smoking in the  postoperative period. The nicotine in cigarettes will inhibit healing of your shoulder repair and decrease the chance of successful repair. Similarly, nicotine containing products (gum, patches) should be avoided.   Post-operative Brace: Apply and remove the brace you received as you were instructed to at the time of fitting and as described in detail as the brace's instructions for use indicate.   Wear the brace for the period of time prescribed by your physician.  The brace can be cleaned with soap and water and allowed to air dry only.  Should the brace result in increased pain, decreased feeling (numbness/tingling), increased swelling or an overall worsening of your medical condition, please contact your doctor immediately.  If an emergency situation occurs as a result of wearing the brace after normal business hours, please dial 911 and seek immediate medical attention.  Let your doctor know if you have any further questions about the brace issued to you. Refer to the shoulder sling instructions for use if you have any questions regarding the correct fit of your shoulder sling.  [facility] Customer Care for Troubleshooting: [phone]  Video that illustrates how to properly use a shoulder sling: "Instructions for Proper Use of an Orthopaedic Sling" http://bass.com/

## 2023-05-23 NOTE — H&P (Signed)
Paper H&P to be scanned into permanent record. H&P reviewed. No significant changes noted.  

## 2023-05-23 NOTE — Anesthesia Procedure Notes (Signed)
Procedure Name: LMA Insertion Date/Time: 05/23/2023 7:54 AM  Performed by: Barbette Hair, CRNAPre-anesthesia Checklist: Patient identified, Emergency Drugs available, Suction available, Patient being monitored and Timeout performed Patient Re-evaluated:Patient Re-evaluated prior to induction Oxygen Delivery Method: Circle system utilized Preoxygenation: Pre-oxygenation with 100% oxygen Induction Type: IV induction LMA: LMA inserted LMA Size: 5.0 Number of attempts: 1 Placement Confirmation: positive ETCO2, CO2 detector and breath sounds checked- equal and bilateral Tube secured with: Tape Dental Injury: Teeth and Oropharynx as per pre-operative assessment

## 2023-05-26 ENCOUNTER — Telehealth: Payer: Self-pay | Admitting: Family Medicine

## 2023-05-26 NOTE — Telephone Encounter (Signed)
Unfortuantely I don't have the answer to this. I would advise them to leave a message for Dr. Allena Katz

## 2023-05-26 NOTE — Telephone Encounter (Signed)
Patients spouse called and wanted to know how long to apply the polor ice cooler pack after patients shoulder surgery. She tried calling Dr. Jill Alexanders office and was unable to reach. Please advise.

## 2023-05-27 HISTORY — PX: SHOULDER SURGERY: SHX246

## 2023-06-06 ENCOUNTER — Encounter: Payer: Self-pay | Admitting: Family Medicine

## 2023-06-06 ENCOUNTER — Ambulatory Visit (INDEPENDENT_AMBULATORY_CARE_PROVIDER_SITE_OTHER): Payer: Medicare PPO | Admitting: Family Medicine

## 2023-06-06 VITALS — BP 138/80 | HR 55 | Ht 69.0 in | Wt 204.6 lb

## 2023-06-06 DIAGNOSIS — I10 Essential (primary) hypertension: Secondary | ICD-10-CM

## 2023-06-06 DIAGNOSIS — J410 Simple chronic bronchitis: Secondary | ICD-10-CM

## 2023-06-06 DIAGNOSIS — N289 Disorder of kidney and ureter, unspecified: Secondary | ICD-10-CM

## 2023-06-06 MED ORDER — ATORVASTATIN CALCIUM 40 MG PO TABS: 40.0000 mg | ORAL_TABLET | Freq: Every day | ORAL | 1 refills | Status: AC

## 2023-06-06 MED ORDER — METOPROLOL SUCCINATE ER 25 MG PO TB24: 25.0000 mg | ORAL_TABLET | Freq: Every day | ORAL | 2 refills | Status: AC

## 2023-06-06 MED ORDER — ALBUTEROL SULFATE HFA 108 (90 BASE) MCG/ACT IN AERS: 2.0000 | INHALATION_SPRAY | Freq: Four times a day (QID) | RESPIRATORY_TRACT | 6 refills | Status: AC | PRN

## 2023-06-06 MED ORDER — HYDROCHLOROTHIAZIDE 25 MG PO TABS: 25.0000 mg | ORAL_TABLET | Freq: Every day | ORAL | 2 refills | Status: AC | PRN

## 2023-06-06 MED ORDER — LOSARTAN POTASSIUM 25 MG PO TABS: 25.0000 mg | ORAL_TABLET | Freq: Every day | ORAL | 2 refills | Status: AC

## 2023-06-06 NOTE — Progress Notes (Signed)
BP 138/80   Pulse (!) 55   Ht 5\' 9"  (1.753 m)   Wt 204 lb 9.6 oz (92.8 kg)   SpO2 96%   BMI 30.21 kg/m    Subjective:    Patient ID: Connor Holmes, male    DOB: Apr 27, 1946, 77 y.o.   MRN: [id number]  HPI: Connor Holmes is a 77 y.o. male  Chief Complaint  Patient presents with   Hypertension   Had surgery on his shoulder about 2 weeks ago. He did not get any oxycodone because it makes him sick and he is not taking the tramadol provided. He is doing PT and following with ortho. He is planning to return to [place]   HYPERTENSION  Hypertension status: controlled  Satisfied with current treatment? yes Duration of hypertension: chronic BP monitoring frequency:  rarely BP medication side effects:  no Medication compliance: excellent compliance Previous BP meds: Aspirin: yes Recurrent headaches: no Visual changes: no Palpitations: no Dyspnea: no Chest pain: no Lower extremity edema: no Dizzy/lightheaded: no   Relevant past medical, surgical, family and social history reviewed and updated as indicated. Interim medical history since our last visit reviewed. Allergies and medications reviewed and updated.  Review of Systems  Constitutional: Negative.   Respiratory: Negative.    Cardiovascular: Negative.   Musculoskeletal:  Positive for arthralgias. Negative for back pain, gait problem, joint swelling, myalgias, neck pain and neck stiffness.  Skin: Negative.   Psychiatric/Behavioral: Negative.      Per HPI unless specifically indicated above     Objective:    BP 138/80   Pulse (!) 55   Ht 5\' 9"  (1.753 m)   Wt 204 lb 9.6 oz (92.8 kg)   SpO2 96%   BMI 30.21 kg/m   Wt Readings from Last 3 Encounters:  06/06/23 204 lb 9.6 oz (92.8 kg)  05/23/23 204 lb 9.6 oz (92.8 kg)  05/21/23 204 lb 12.8 oz (92.9 kg)    Physical Exam Vitals and nursing note reviewed.  Constitutional:      General: He is not in acute distress.    Appearance: Normal appearance. He is normal  weight. He is not ill-appearing, toxic-appearing or diaphoretic.  HENT:     Head: Normocephalic and atraumatic.     Right Ear: External ear normal.     Left Ear: External ear normal.     Nose: Nose normal.     Mouth/Throat:     Mouth: Mucous membranes are moist.     Pharynx: Oropharynx is clear.  Eyes:     General: No scleral icterus.       Right eye: No discharge.        Left eye: No discharge.     Extraocular Movements: Extraocular movements intact.     Conjunctiva/sclera: Conjunctivae normal.     Pupils: Pupils are equal, round, and reactive to light.  Cardiovascular:     Rate and Rhythm: Normal rate and regular rhythm.     Pulses: Normal pulses.     Heart sounds: Normal heart sounds. No murmur heard.    No friction rub. No gallop.  Pulmonary:     Effort: Pulmonary effort is normal. No respiratory distress.     Breath sounds: Normal breath sounds. No stridor. No wheezing, rhonchi or rales.  Chest:     Chest wall: No tenderness.  Musculoskeletal:     Cervical back: Normal range of motion and neck supple.     Comments: R arm in sling  Skin:  General: Skin is warm and dry.     Capillary Refill: Capillary refill takes less than 2 seconds.     Coloration: Skin is not jaundiced or pale.     Findings: No bruising, erythema, lesion or rash.  Neurological:     General: No focal deficit present.     Mental Status: He is alert and oriented to person, place, and time. Mental status is at baseline.  Psychiatric:        Mood and Affect: Mood normal.        Behavior: Behavior normal.        Thought Content: Thought content normal.        Judgment: Judgment normal.     Results for orders placed or performed in visit on 05/20/23  Basic metabolic panel  Result Value Ref Range   Glucose 106 (H) 70 - 99 mg/dL   BUN 27 8 - 27 mg/dL   Creatinine, Ser 4.09 (H) 0.76 - 1.27 mg/dL   eGFR 55 (L) >81 XB/JYN/8.29   BUN/Creatinine Ratio 20 10 - 24   Sodium 140 134 - 144 mmol/L    Potassium 4.7 3.5 - 5.2 mmol/L   Chloride 102 96 - 106 mmol/L   CO2 23 20 - 29 mmol/L   Calcium 9.7 8.6 - 10.2 mg/dL      Assessment & Plan:   Problem List Items Addressed This Visit       Cardiovascular and Mediastinum   Primary hypertension - Primary    Under good control on current regimen. Continue current regimen. Continue to monitor. Call with any concerns. Refills given. Labs drawn today.        Relevant Medications   atorvastatin (LIPITOR) 40 MG tablet   hydrochlorothiazide (HYDRODIURIL) 25 MG tablet   losartan (COZAAR) 25 MG tablet   metoprolol succinate (TOPROL-XL) 25 MG 24 hr tablet   Other Visit Diagnoses     Abnormal kidney function       Rechecking labs today. Call with any concerns.   Relevant Orders   Basic metabolic panel   Simple chronic bronchitis (HCC)       Relevant Medications   albuterol (VENTOLIN HFA) 108 (90 Base) MCG/ACT inhaler        Follow up plan: Return for when he gets back from [place]

## 2023-06-06 NOTE — Assessment & Plan Note (Signed)
Under good control on current regimen. Continue current regimen. Continue to monitor. Call with any concerns. Refills given. Labs drawn today.   

## 2023-06-07 LAB — BASIC METABOLIC PANEL
BUN/Creatinine Ratio: 18 (ref 10–24)
BUN: 21 mg/dL (ref 8–27)
CO2: 20 mmol/L (ref 20–29)
Calcium: 9.7 mg/dL (ref 8.6–10.2)
Chloride: 102 mmol/L (ref 96–106)
Creatinine, Ser: 1.17 mg/dL (ref 0.76–1.27)
Glucose: 106 mg/dL — ABNORMAL HIGH (ref 70–99)
Potassium: 4.3 mmol/L (ref 3.5–5.2)
Sodium: 140 mmol/L (ref 134–144)
eGFR: 64 mL/min/{1.73_m2} (ref >59)

## 2023-06-25 IMAGING — DX DG CERVICAL SPINE COMPLETE 4+V
6 series · 6 of 6 positions shown · non-contrast
Comparison: None Available.

CLINICAL DATA: Acute right shoulder pain for 2 months.

EXAM:
CERVICAL SPINE - COMPLETE 4+ VIEW

[c-spine lat]
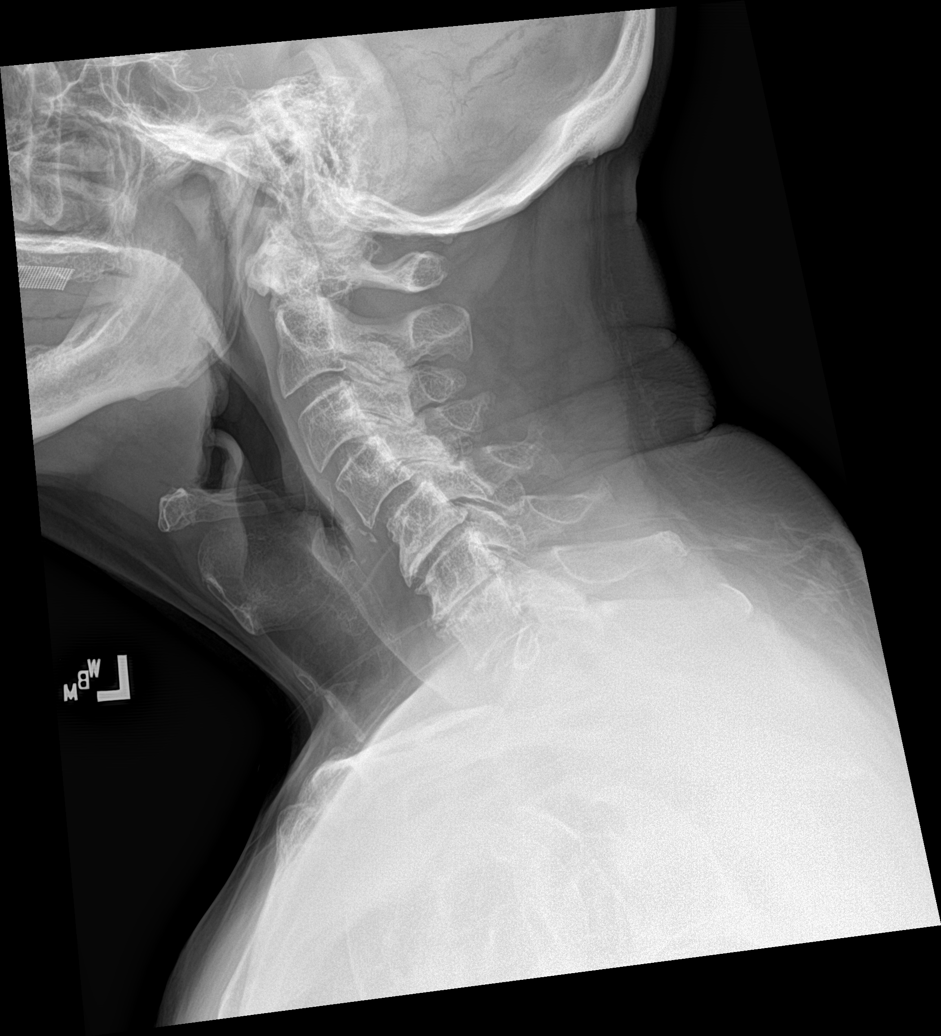

[c-spine obl (1 of 2)]
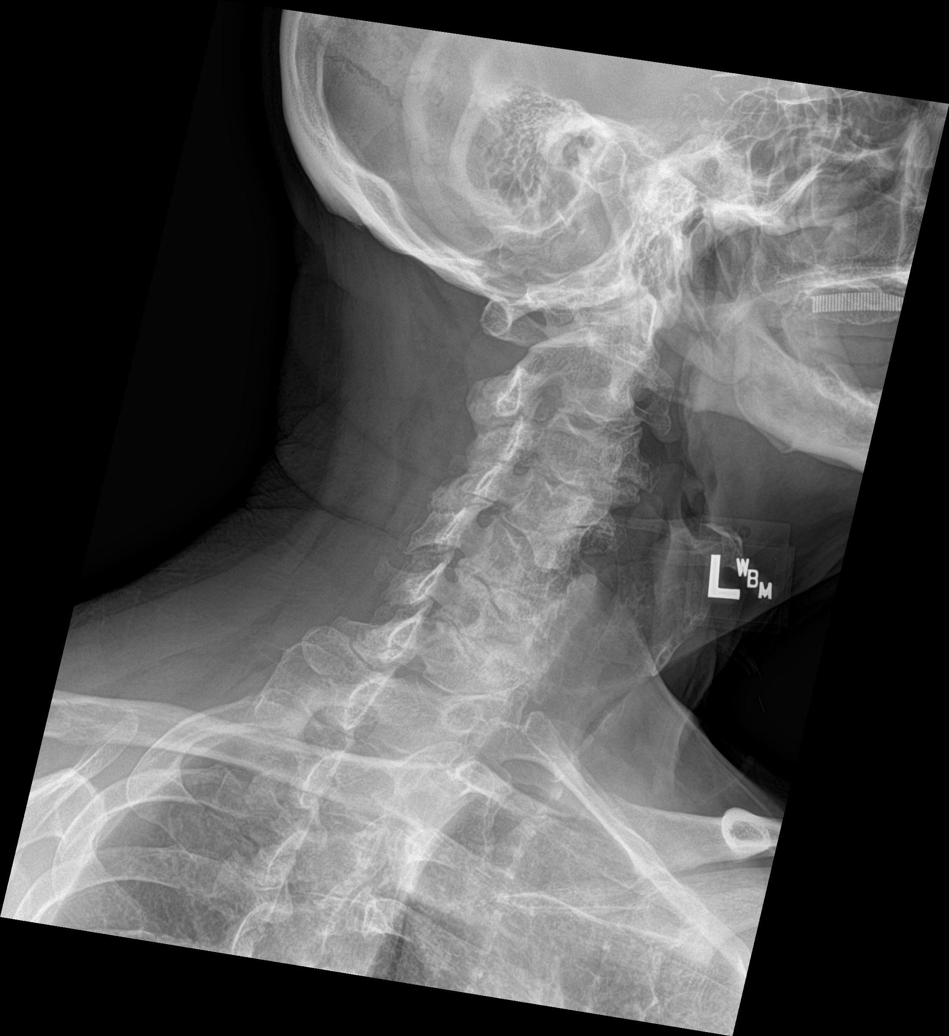

[c-spine obl (2 of 2)]
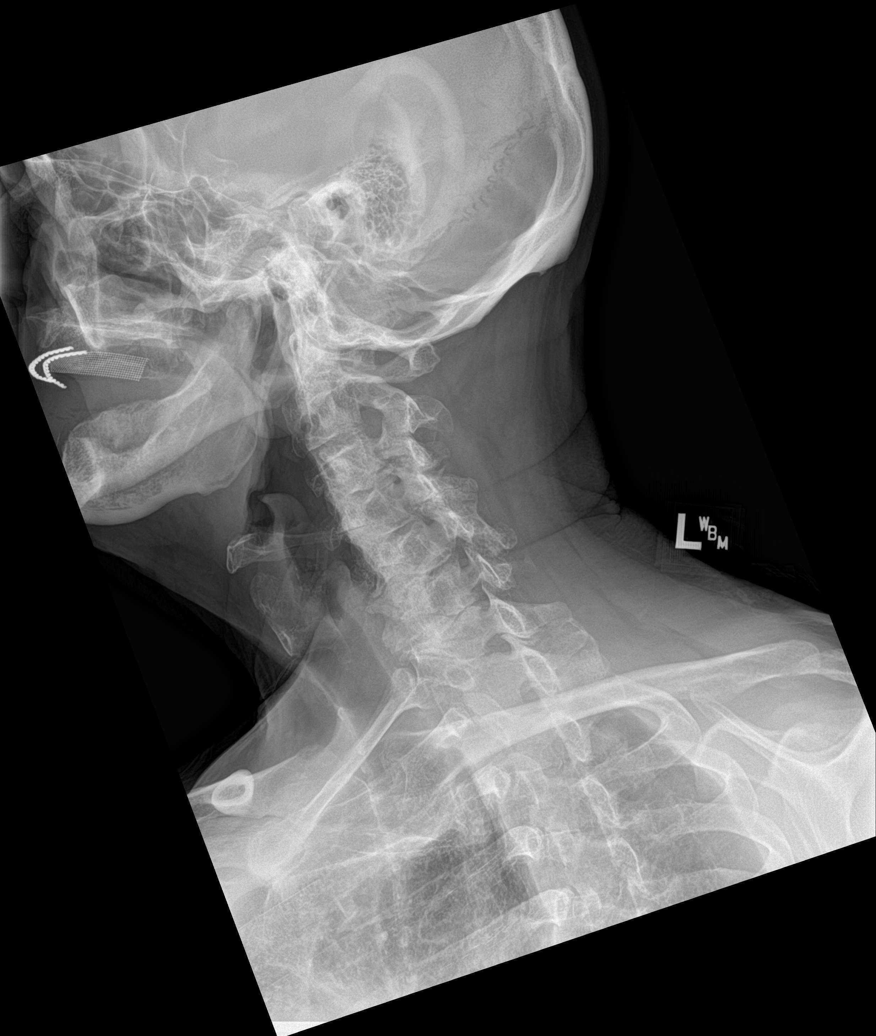

[c-spine ap]
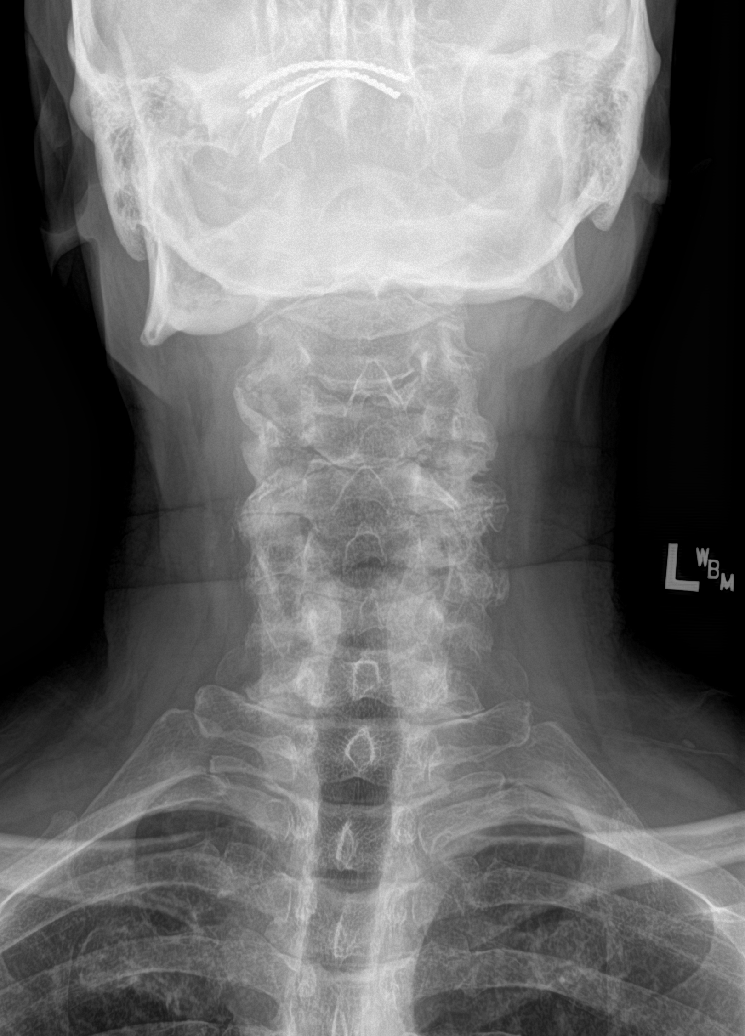

[c-spine open mouth]
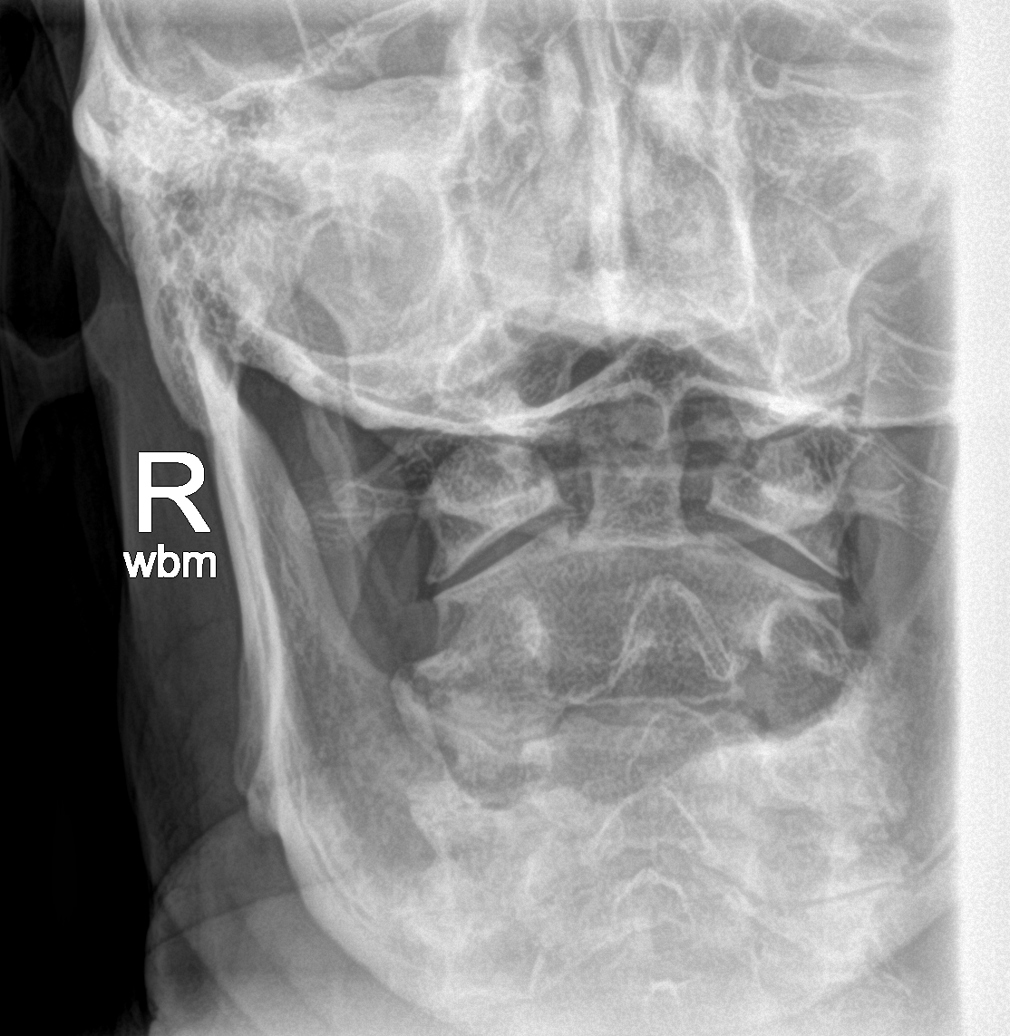

[c-spine swimmers]
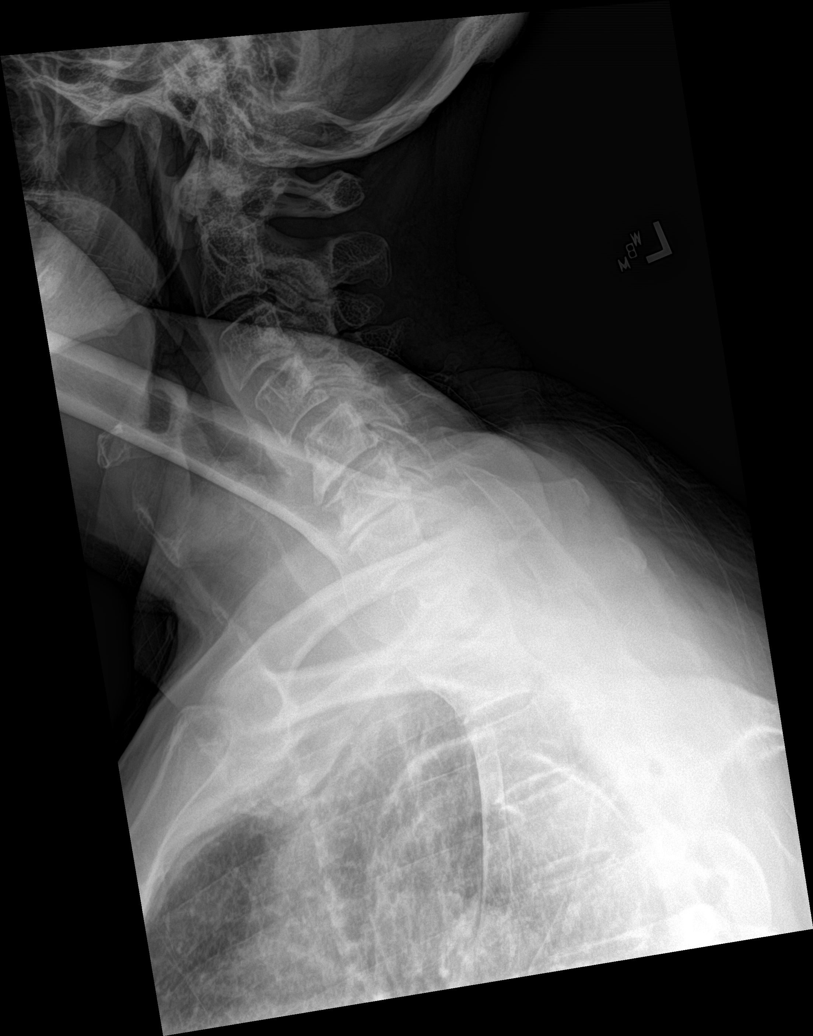

[6 of 6 positions shown; findings below may reference images not displayed]

FINDINGS: There is no evidence of an acute cervical spine fracture or
prevertebral soft tissue swelling. There is 1 mm to 2 mm
anterolisthesis of the C4 vertebral body on C5. Moderate severity
endplate sclerosis and anterior osteophyte formation are seen at the
levels of C5-C6 and C6-C7. Bilateral marked severity multilevel
facet joint hypertrophy is noted. Marked severity intervertebral
disc space narrowing is seen at C6-C7 with mild to moderate severity
intervertebral disc space narrowing at C5-C6. No other significant
bone abnormalities are identified.
IMPRESSION: Moderate and marked severity multilevel degenerative changes, as
described above.

## 2023-06-25 IMAGING — DX DG SHOULDER 2+V*R*
3 series · 3 of 3 positions shown · non-contrast
Comparison: None Available.

CLINICAL DATA: Acute right shoulder pain for 2 months.

EXAM:
RIGHT SHOULDER - 2+ VIEW

[shoulder ap]
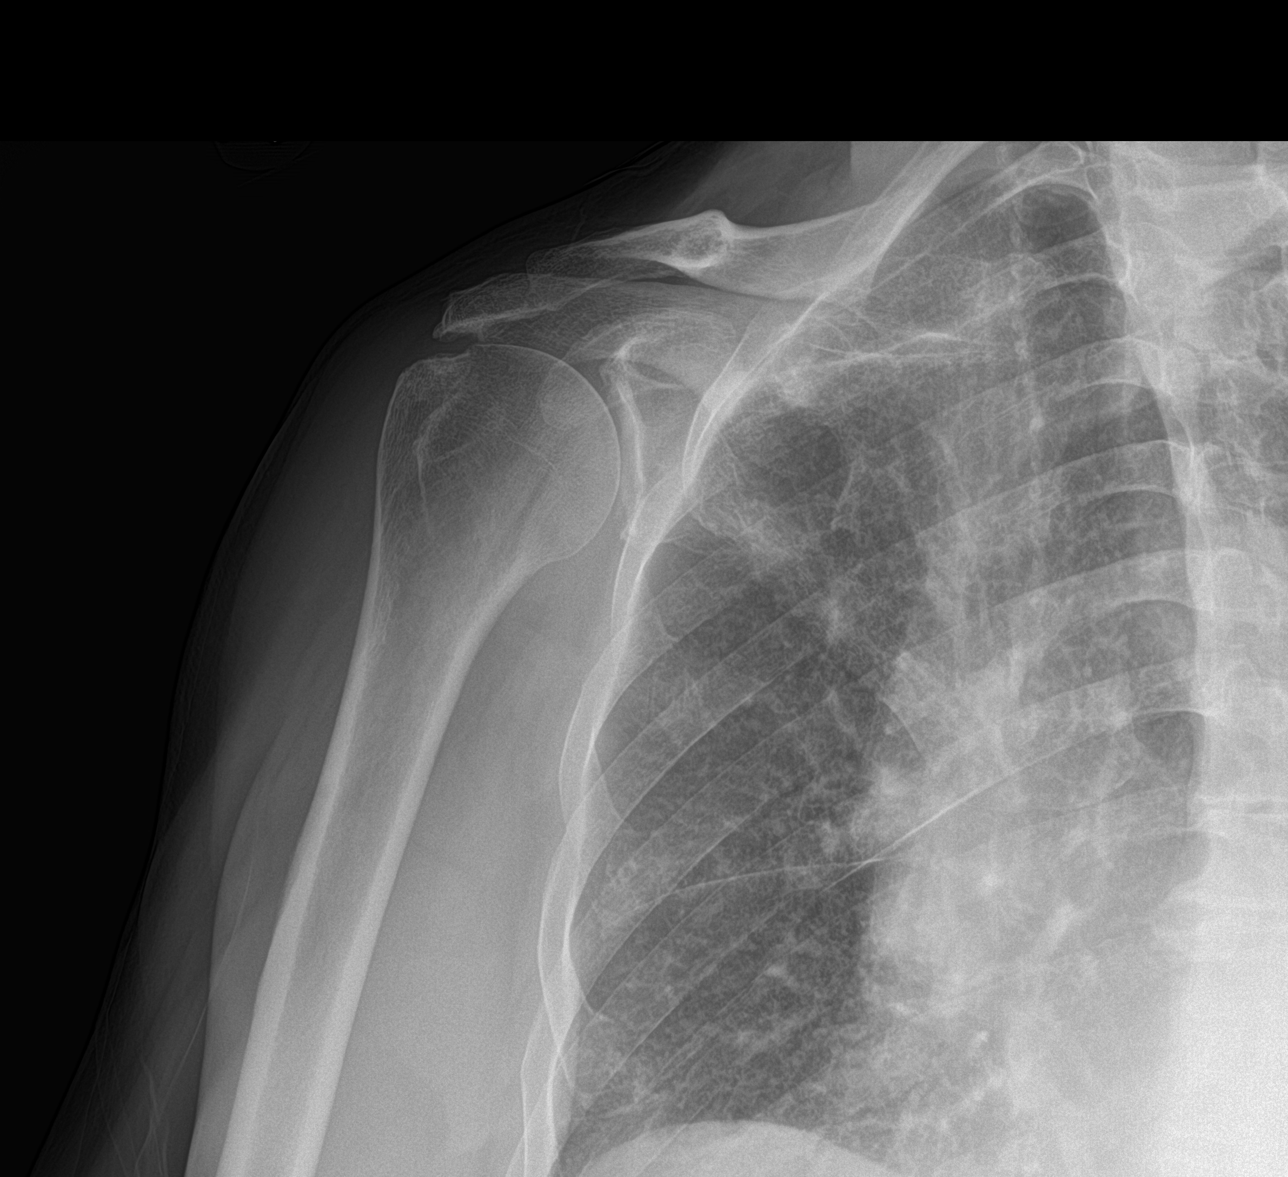

[shoulder y-view]
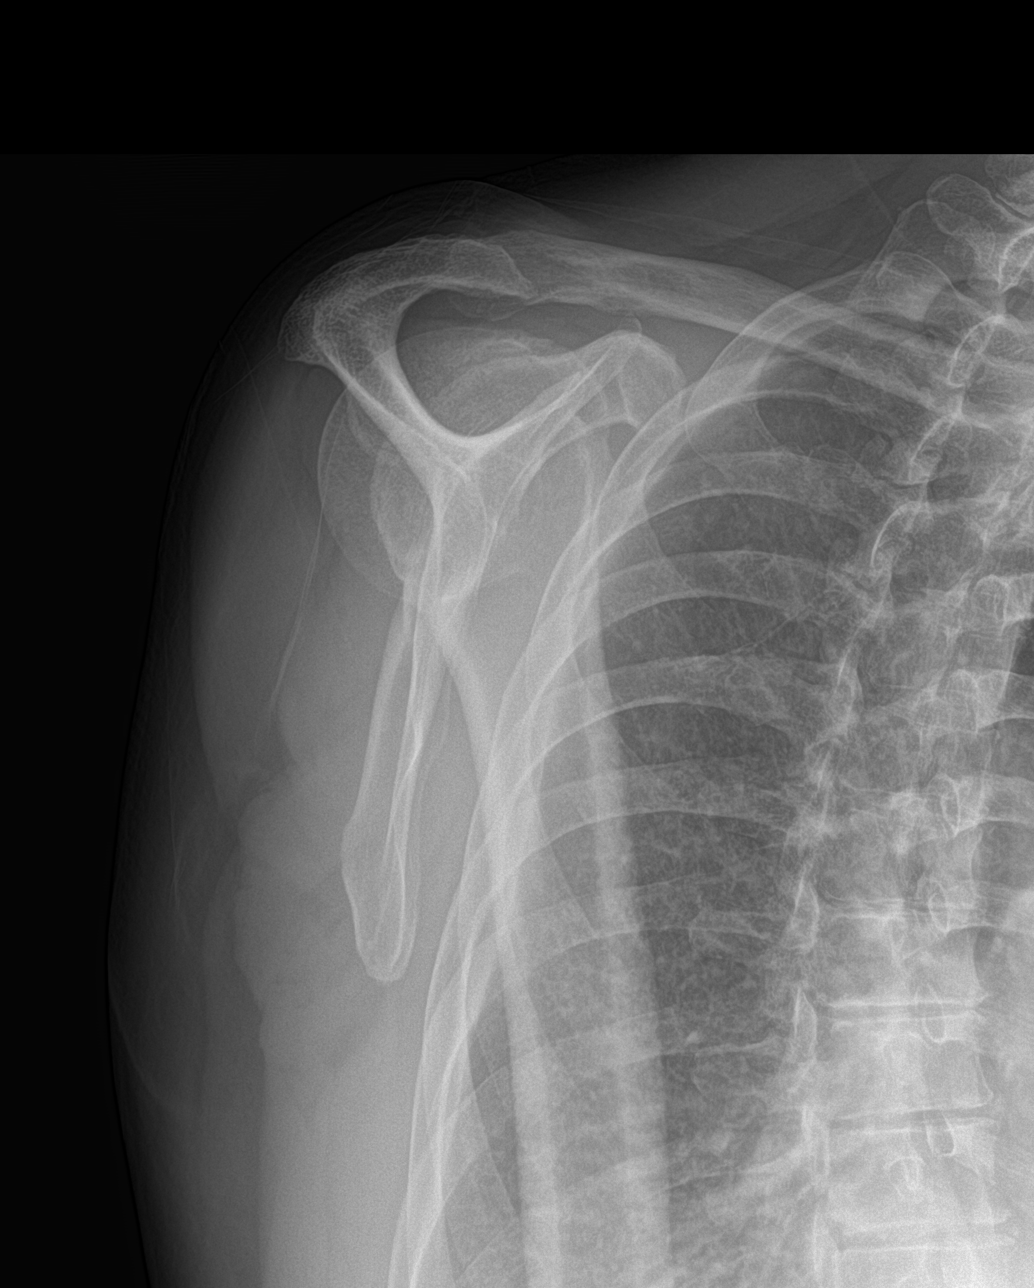

[shoulder axial]
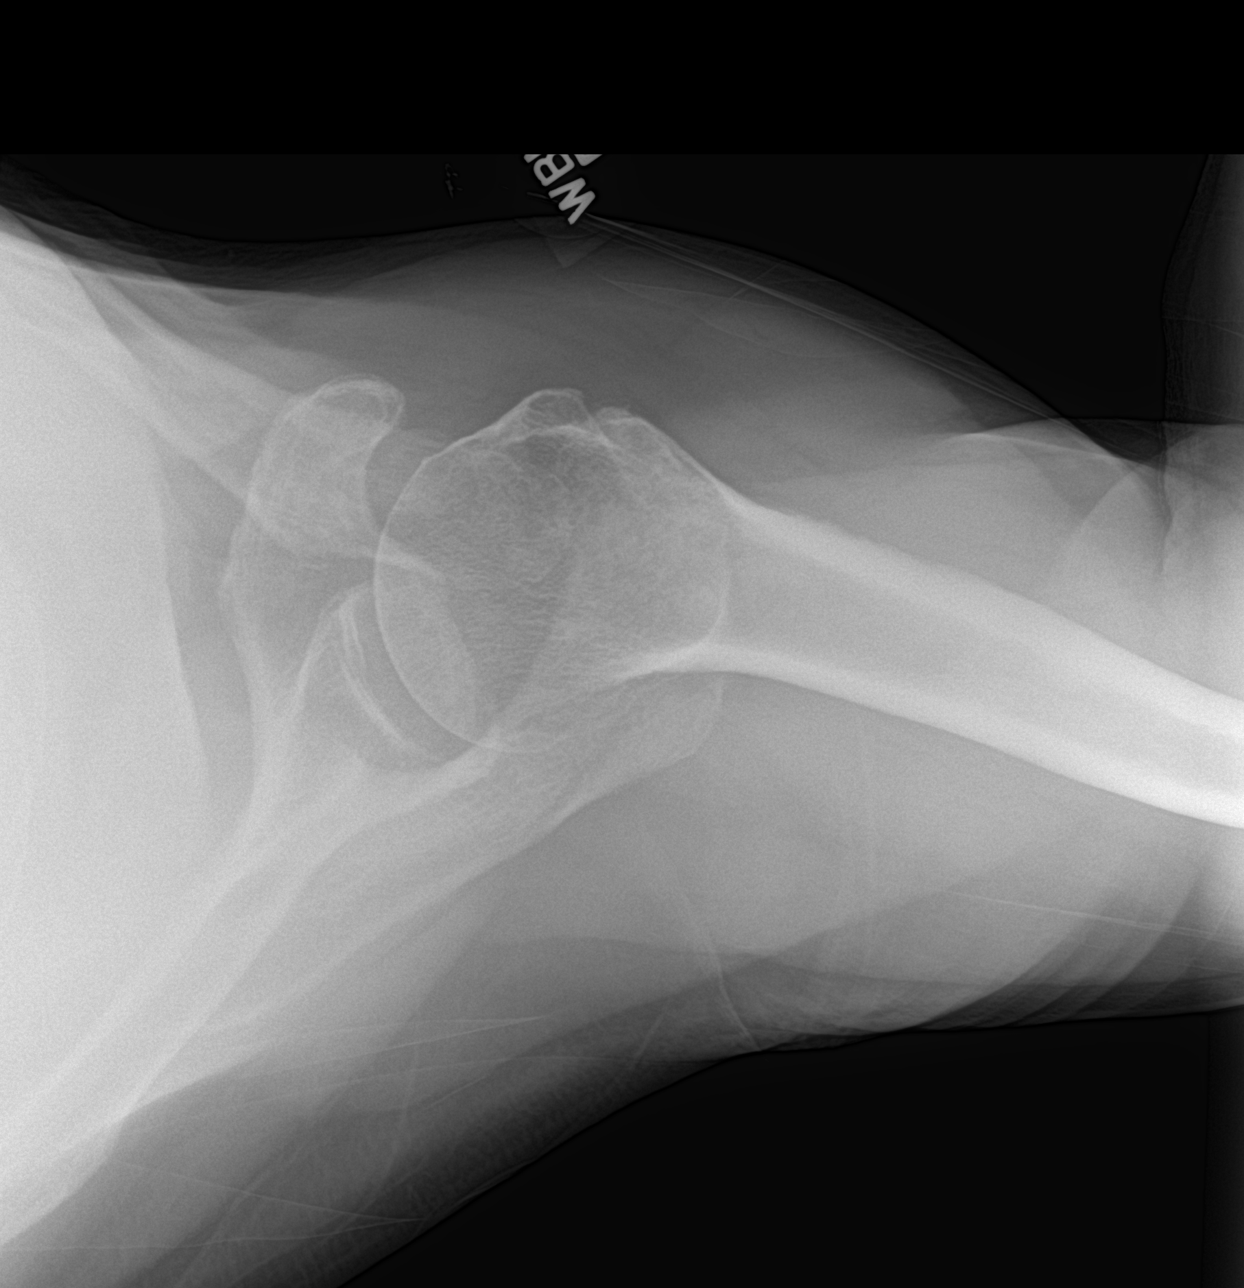

[3 of 3 positions shown; findings below may reference images not displayed]

FINDINGS: There is no evidence of fracture or dislocation. Mild degenerative
changes are seen involving the right acromioclavicular joint and
right glenohumeral articulation. Soft tissues are unremarkable.
IMPRESSION: Mild degenerative changes involving the right acromioclavicular
joint and right glenohumeral articulation.

## 2024-05-17 ENCOUNTER — Other Ambulatory Visit: Payer: Self-pay | Admitting: Family Medicine

## 2024-05-18 NOTE — Telephone Encounter (Signed)
 Called patient and left a message for him to call back to get scheduled. Overdue for a visit.

## 2024-05-18 NOTE — Telephone Encounter (Signed)
 Patient is overdue for an appointment. Please call to schedule and then route to provider for refill.

## 2024-05-18 NOTE — Telephone Encounter (Signed)
 Requested medications are due for refill today.  yes  Requested medications are on the active medications list.  yes  Last refill. varied  Future visit scheduled.   no  Notes to clinic.  Pt last seen 06/06/2023. Pt cancelled ov on 03/30/2024. Labs are expired.    Requested Prescriptions  Pending Prescriptions Disp Refills   atorvastatin  (LIPITOR) 40 MG tablet [Pharmacy Med Name: ATORVASTATIN  CALCIUM  40 MG Oral Tablet] 90 tablet 3    Sig: TOME 1 TABLETA CADA DIA     Cardiovascular:  Antilipid - Statins Failed - 05/18/2024  3:12 PM      Failed - Valid encounter within last 12 months    Recent Outpatient Visits   None            Failed - Lipid Panel in normal range within the last 12 months    Cholesterol, Total  Date Value Ref Range Status  05/06/2023 152 100 - 199 mg/dL Final   LDL Chol Calc (NIH)  Date Value Ref Range Status  05/06/2023 91 0 - 99 mg/dL Final   HDL  Date Value Ref Range Status  05/06/2023 39 (L) >39 mg/dL Final   Triglycerides  Date Value Ref Range Status  05/06/2023 121 0 - 149 mg/dL Final         Passed - Patient is not pregnant       hydrochlorothiazide  (HYDRODIURIL ) 25 MG tablet [Pharmacy Med Name: HYDROCHLOROTHIAZIDE  25 MG Oral Tablet] 90 tablet 3    Sig: TOME 1 TABLETA CADA DIA SEGUN SE NECESITE PARA HINCHAZON     Cardiovascular: Diuretics - Thiazide Failed - 05/18/2024  3:12 PM      Failed - Cr in normal range and within 180 days    Creatinine, Ser  Date Value Ref Range Status  06/06/2023 1.17 0.76 - 1.27 mg/dL Final         Failed - K in normal range and within 180 days    Potassium  Date Value Ref Range Status  06/06/2023 4.3 3.5 - 5.2 mmol/L Final         Failed - Na in normal range and within 180 days    Sodium  Date Value Ref Range Status  06/06/2023 140 134 - 144 mmol/L Final         Failed - Valid encounter within last 6 months    Recent Outpatient Visits   None            Passed - Last BP in normal range    BP  Readings from Last 1 Encounters:  06/06/23 138/80          levocetirizine (XYZAL ) 5 MG tablet [Pharmacy Med Name: LEVOCETIRIZINE DIHYDROCHLORIDE  5 MG Oral Tablet] 90 tablet 3    Sig: TOME 1 TABLETA CADA TARDE     Ear, Nose, and Throat:  Antihistamines - levocetirizine dihydrochloride  Failed - 05/18/2024  3:12 PM      Failed - Valid encounter within last 12 months    Recent Outpatient Visits   None            Passed - Cr in normal range and within 360 days    Creatinine, Ser  Date Value Ref Range Status  06/06/2023 1.17 0.76 - 1.27 mg/dL Final         Passed - eGFR is 10 or above and within 360 days    eGFR  Date Value Ref Range Status  06/06/2023 64 >59 mL/min/1.73 Final

## 2024-05-19 NOTE — Telephone Encounter (Unsigned)
 Copied from CRM #[id number]. Topic: Clinical - Prescription Issue >> May 18, 2024  3:30 PM Kevelyn M wrote: Reason for CRM: Patient calling in and he received message. He is unable to schedule an appointment because he's still in [place] He will be there for another 4 months but he needs the medications filled.  Call back #[phone]
